# Patient Record
Sex: Male | Born: 1955 | Race: White | Hispanic: No | Smoking: Current every day smoker
Health system: Southern US, Community
[De-identification: ages and names within clinical notes are randomized; demographics above are authoritative.]

## PROBLEM LIST (undated history)

## (undated) DIAGNOSIS — Z8739 Personal history of other diseases of the musculoskeletal system and connective tissue: Secondary | ICD-10-CM

## (undated) DIAGNOSIS — N189 Chronic kidney disease, unspecified: Secondary | ICD-10-CM

## (undated) DIAGNOSIS — E785 Hyperlipidemia, unspecified: Secondary | ICD-10-CM

## (undated) DIAGNOSIS — H3530 Unspecified macular degeneration: Secondary | ICD-10-CM

## (undated) DIAGNOSIS — Z862 Personal history of diseases of the blood and blood-forming organs and certain disorders involving the immune mechanism: Secondary | ICD-10-CM

## (undated) DIAGNOSIS — J449 Chronic obstructive pulmonary disease, unspecified: Secondary | ICD-10-CM

## (undated) DIAGNOSIS — I10 Essential (primary) hypertension: Secondary | ICD-10-CM

## (undated) DIAGNOSIS — E119 Type 2 diabetes mellitus without complications: Secondary | ICD-10-CM

## (undated) DIAGNOSIS — J302 Other seasonal allergic rhinitis: Secondary | ICD-10-CM

## (undated) DIAGNOSIS — N289 Disorder of kidney and ureter, unspecified: Secondary | ICD-10-CM

## (undated) DIAGNOSIS — K219 Gastro-esophageal reflux disease without esophagitis: Secondary | ICD-10-CM

## (undated) DIAGNOSIS — I2510 Atherosclerotic heart disease of native coronary artery without angina pectoris: Secondary | ICD-10-CM

## (undated) DIAGNOSIS — R609 Edema, unspecified: Secondary | ICD-10-CM

## (undated) DIAGNOSIS — E039 Hypothyroidism, unspecified: Secondary | ICD-10-CM

## (undated) HISTORY — DX: Edema, unspecified: R60.9

## (undated) HISTORY — DX: Hyperlipidemia, unspecified: E78.5

## (undated) HISTORY — DX: Personal history of other diseases of the musculoskeletal system and connective tissue: Z87.39

## (undated) HISTORY — DX: Other seasonal allergic rhinitis: J30.2

## (undated) HISTORY — DX: Atherosclerotic heart disease of native coronary artery without angina pectoris: I25.10

## (undated) HISTORY — DX: Essential (primary) hypertension: I10

## (undated) HISTORY — DX: Personal history of diseases of the blood and blood-forming organs and certain disorders involving the immune mechanism: Z86.2

## (undated) HISTORY — DX: Chronic kidney disease, unspecified: N18.9

## (undated) HISTORY — DX: Unspecified macular degeneration: H35.30

## (undated) HISTORY — DX: Chronic obstructive pulmonary disease, unspecified: J44.9

## (undated) HISTORY — DX: Disorder of kidney and ureter, unspecified: N28.9

---

## 2011-08-04 DIAGNOSIS — I219 Acute myocardial infarction, unspecified: Secondary | ICD-10-CM | POA: Insufficient documentation

## 2011-08-04 DIAGNOSIS — I252 Old myocardial infarction: Secondary | ICD-10-CM | POA: Insufficient documentation

## 2011-08-04 HISTORY — DX: Old myocardial infarction: I25.2

## 2011-08-04 HISTORY — PX: CORONARY ANGIOPLASTY WITH STENT PLACEMENT: SHX49

## 2011-08-04 HISTORY — DX: Acute myocardial infarction, unspecified: I21.9

## 2015-03-22 DIAGNOSIS — I10 Essential (primary) hypertension: Secondary | ICD-10-CM | POA: Insufficient documentation

## 2015-03-22 DIAGNOSIS — F17200 Nicotine dependence, unspecified, uncomplicated: Secondary | ICD-10-CM

## 2015-03-22 DIAGNOSIS — I2510 Atherosclerotic heart disease of native coronary artery without angina pectoris: Secondary | ICD-10-CM

## 2015-03-22 DIAGNOSIS — E7800 Pure hypercholesterolemia, unspecified: Secondary | ICD-10-CM

## 2015-03-22 HISTORY — DX: Atherosclerotic heart disease of native coronary artery without angina pectoris: I25.10

## 2015-03-22 HISTORY — DX: Essential (primary) hypertension: I10

## 2015-03-22 HISTORY — DX: Nicotine dependence, unspecified, uncomplicated: F17.200

## 2015-03-22 HISTORY — DX: Pure hypercholesterolemia, unspecified: E78.00

## 2017-07-16 DIAGNOSIS — Z720 Tobacco use: Secondary | ICD-10-CM

## 2017-07-16 DIAGNOSIS — E785 Hyperlipidemia, unspecified: Secondary | ICD-10-CM | POA: Insufficient documentation

## 2017-07-16 HISTORY — DX: Tobacco use: Z72.0

## 2020-05-14 ENCOUNTER — Encounter: Payer: Self-pay | Admitting: Cardiology

## 2020-05-14 DIAGNOSIS — Z8739 Personal history of other diseases of the musculoskeletal system and connective tissue: Secondary | ICD-10-CM

## 2020-05-14 DIAGNOSIS — J302 Other seasonal allergic rhinitis: Secondary | ICD-10-CM | POA: Insufficient documentation

## 2020-05-14 DIAGNOSIS — R609 Edema, unspecified: Secondary | ICD-10-CM | POA: Insufficient documentation

## 2020-05-14 DIAGNOSIS — I2510 Atherosclerotic heart disease of native coronary artery without angina pectoris: Secondary | ICD-10-CM | POA: Insufficient documentation

## 2020-05-14 DIAGNOSIS — I10 Essential (primary) hypertension: Secondary | ICD-10-CM | POA: Insufficient documentation

## 2020-05-14 DIAGNOSIS — E785 Hyperlipidemia, unspecified: Secondary | ICD-10-CM | POA: Insufficient documentation

## 2020-05-14 DIAGNOSIS — J449 Chronic obstructive pulmonary disease, unspecified: Secondary | ICD-10-CM | POA: Insufficient documentation

## 2020-05-14 DIAGNOSIS — N289 Disorder of kidney and ureter, unspecified: Secondary | ICD-10-CM

## 2020-05-14 DIAGNOSIS — N189 Chronic kidney disease, unspecified: Secondary | ICD-10-CM

## 2020-05-14 DIAGNOSIS — Z862 Personal history of diseases of the blood and blood-forming organs and certain disorders involving the immune mechanism: Secondary | ICD-10-CM

## 2020-05-14 DIAGNOSIS — H3530 Unspecified macular degeneration: Secondary | ICD-10-CM | POA: Insufficient documentation

## 2020-06-04 ENCOUNTER — Ambulatory Visit (INDEPENDENT_AMBULATORY_CARE_PROVIDER_SITE_OTHER): Payer: Medicare Other | Admitting: Cardiology

## 2020-06-04 ENCOUNTER — Other Ambulatory Visit: Payer: Self-pay

## 2020-06-04 ENCOUNTER — Encounter: Payer: Self-pay | Admitting: Cardiology

## 2020-06-04 VITALS — BP 80/40 | HR 56 | Ht 68.0 in | Wt 206.0 lb

## 2020-06-04 DIAGNOSIS — I2510 Atherosclerotic heart disease of native coronary artery without angina pectoris: Secondary | ICD-10-CM | POA: Diagnosis not present

## 2020-06-04 DIAGNOSIS — I10 Essential (primary) hypertension: Secondary | ICD-10-CM | POA: Diagnosis not present

## 2020-06-04 DIAGNOSIS — J410 Simple chronic bronchitis: Secondary | ICD-10-CM | POA: Diagnosis not present

## 2020-06-04 DIAGNOSIS — I739 Peripheral vascular disease, unspecified: Secondary | ICD-10-CM

## 2020-06-04 DIAGNOSIS — R0600 Dyspnea, unspecified: Secondary | ICD-10-CM

## 2020-06-04 DIAGNOSIS — E782 Mixed hyperlipidemia: Secondary | ICD-10-CM | POA: Diagnosis not present

## 2020-06-04 DIAGNOSIS — F17200 Nicotine dependence, unspecified, uncomplicated: Secondary | ICD-10-CM

## 2020-06-04 DIAGNOSIS — R0609 Other forms of dyspnea: Secondary | ICD-10-CM

## 2020-06-04 HISTORY — DX: Peripheral vascular disease, unspecified: I73.9

## 2020-06-04 NOTE — Patient Instructions (Signed)
Medication Instructions:  Your physician recommends that you continue on your current medications as directed. Please refer to the Current Medication list given to you today.  *If you need a refill on your cardiac medications before your next appointment, please call your pharmacy*   Lab Work: None. If you have labs (blood work) drawn today and your tests are completely normal, you will receive your results only by: Marland Kitchen MyChart Message (if you have MyChart) OR . A paper copy in the mail If you have any lab test that is abnormal or we need to change your treatment, we will call you to review the results.   Testing/Procedures: Your physician has requested that you have an echocardiogram. Echocardiography is a painless test that uses sound waves to create images of your heart. It provides your doctor with information about the size and shape of your heart and how well your heart's chambers and valves are working. This procedure takes approximately one hour. There are no restrictions for this procedure.  Your physician has requested that you have a lower or upper extremity arterial duplex. This test is an ultrasound of the arteries in the legs or arms. It looks at arterial blood flow in the legs and arms. Allow one hour for Lower and Upper Arterial scans. There are no restrictions or special instructions    Follow-Up: At [facility], you and your health needs are our priority.  As part of our continuing mission to provide you with exceptional heart care, we have created designated Provider Care Teams.  These Care Teams include your primary Cardiologist (physician) and Advanced Practice Providers (APPs -  Physician Assistants and Nurse Practitioners) who all work together to provide you with the care you need, when you need it.  We recommend signing up for the patient portal called "MyChart".  Sign up information is provided on this After Visit Summary.  MyChart is used to connect with patients  for Virtual Visits (Telemedicine).  Patients are able to view lab/test results, encounter notes, upcoming appointments, etc.  Non-urgent messages can be sent to your provider as well.   To learn more about what you can do with MyChart, go to [email or web address].    Your next appointment:   3 month(s)  The format for your next appointment:   In Person  Provider:   Jenne Campus, MD   Other Instructions   Echocardiogram An echocardiogram is a procedure that uses painless sound waves (ultrasound) to produce an image of the heart. Images from an echocardiogram can provide important information about:  Signs of coronary artery disease (CAD).  Aneurysm detection. An aneurysm is a weak or damaged part of an artery wall that bulges out from the normal force of blood pumping through the body.  Heart size and shape. Changes in the size or shape of the heart can be associated with certain conditions, including heart failure, aneurysm, and CAD.  Heart muscle function.  Heart valve function.  Signs of a past heart attack.  Fluid buildup around the heart.  Thickening of the heart muscle.  A tumor or infectious growth around the heart valves. Tell a health care provider about:  Any allergies you have.  All medicines you are taking, including vitamins, herbs, eye drops, creams, and over-the-counter medicines.  Any blood disorders you have.  Any surgeries you have had.  Any medical conditions you have.  Whether you are pregnant or may be pregnant. What are the risks? Generally, this is a safe procedure. However, problems  may occur, including:  Allergic reaction to dye (contrast) that may be used during the procedure. What happens before the procedure? No specific preparation is needed. You may eat and drink normally. What happens during the procedure?   An IV tube may be inserted into one of your veins.  You may receive contrast through this tube. A contrast is an  injection that improves the quality of the pictures from your heart.  A gel will be applied to your chest.  A wand-like tool (transducer) will be moved over your chest. The gel will help to transmit the sound waves from the transducer.  The sound waves will harmlessly bounce off of your heart to allow the heart images to be captured in real-time motion. The images will be recorded on a computer. The procedure may vary among health care providers and hospitals. What happens after the procedure?  You may return to your normal, everyday life, including diet, activities, and medicines, unless your health care provider tells you not to do that. Summary  An echocardiogram is a procedure that uses painless sound waves (ultrasound) to produce an image of the heart.  Images from an echocardiogram can provide important information about the size and shape of your heart, heart muscle function, heart valve function, and fluid buildup around your heart.  You do not need to do anything to prepare before this procedure. You may eat and drink normally.  After the echocardiogram is completed, you may return to your normal, everyday life, unless your health care provider tells you not to do that. This information is not intended to replace advice given to you by your health care provider. Make sure you discuss any questions you have with your health care provider. Document Revised: 11/10/2018 Document Reviewed: 08/22/2016 Elsevier Patient Education  [place].

## 2020-06-04 NOTE — Progress Notes (Signed)
Cardiology Consultation:    Date:  06/04/2020   ID:  Todd Mckee, DOB September 30, 1955, MRN [id number]  PCP:  Helen Hashimoto., MD  Cardiologist:  Jenne Campus, MD   Referring MD: Helen Hashimoto., MD   Chief Complaint  Patient presents with  . Coronary Artery Disease    History of Present Illness:    Todd Mckee is a 64 y.o. male who is being seen today for the evaluation of coronary artery disease at the request of Helen Hashimoto., MD.  With past medical history significant for coronary artery disease, status post PTCA and stenting of RCA and LAD which was done in 2013, chronic smoking, hyperlipidemia, essential hypertension, COPD.  I have seen him last time in 2017.  He is being followed by group from [facility] likely is being seen on a yearly basis.  Denies have any cardiac complaints meaning there is no chest pain tightness squeezing pressure burning chest but his exercise is severely limited because of knee as well as leg pain.  On top of that recently he noticed swelling of his face legs arms he was given some diuretic with good response and now does not have swelling anymore.  He still continues to smoke however he quit for a while when he was put on Chantix, he learned the Chantix has been withdrawn from the market and immediately stop it.  Walking is difficult because of right knee pain as well as calf and thigh sprain.  He does have mild strain.  Past Medical History:  Diagnosis Date  . COPD (chronic obstructive pulmonary disease) ([place])   . Coronary artery disease   . H/O degenerative disc disease   . History of anemia due to chronic kidney disease   . Hyperlipidemia   . Hypertension   . Macular degeneration   . Myocardial infarct ([place]) 2013  . Peripheral edema   . Renal insufficiency   . Seasonal allergic rhinitis     Past Surgical History:  Procedure Laterality Date  . CORONARY ANGIOPLASTY WITH STENT PLACEMENT  2013    Current  Medications: Current Meds  Medication Sig  . albuterol (VENTOLIN HFA) 108 (90 Base) MCG/ACT inhaler SMARTSIG:2 Puff(s) By Mouth Every 6 Hours PRN  . aspirin EC 81 MG tablet Take 81 mg by mouth daily. Swallow whole.  . empagliflozin (JARDIANCE) 10 MG TABS tablet Take 10 mg by mouth daily.  . furosemide (LASIX) 40 MG tablet Take 40 mg by mouth.  Marland Kitchen lisinopril (ZESTRIL) 2.5 MG tablet Take 2.5 mg by mouth daily.  . metoprolol tartrate (LOPRESSOR) 25 MG tablet Take 25 mg by mouth daily.  . rosuvastatin (CRESTOR) 40 MG tablet Take 40 mg by mouth daily.  Marland Kitchen terazosin (HYTRIN) 5 MG capsule Take 5 mg by mouth at bedtime.     Allergies:   Patient has no known allergies.   Social History   Socioeconomic History  . Marital status: Divorced    Spouse name: Not on file  . Number of children: Not on file  . Years of education: Not on file  . Highest education level: Not on file  Occupational History  . Not on file  Tobacco Use  . Smoking status: Current Every Day Smoker  . Smokeless tobacco: Never Used  Substance and Sexual Activity  . Alcohol use: Never  . Drug use: Never  . Sexual activity: Not on file  Other Topics Concern  . Not on file  Social History Narrative  . Not on  file   Social Determinants of Health   Financial Resource Strain:   . Difficulty of Paying Living Expenses: Not on file  Food Insecurity:   . Worried About Charity fundraiser in the Last Year: Not on file  . Ran Out of Food in the Last Year: Not on file  Transportation Needs:   . Lack of Transportation (Medical): Not on file  . Lack of Transportation (Non-Medical): Not on file  Physical Activity:   . Days of Exercise per Week: Not on file  . Minutes of Exercise per Session: Not on file  Stress:   . Feeling of Stress : Not on file  Social Connections:   . Frequency of Communication with Friends and Family: Not on file  . Frequency of Social Gatherings with Friends and Family: Not on file  . Attends  Religious Services: Not on file  . Active Member of Clubs or Organizations: Not on file  . Attends Archivist Meetings: Not on file  . Marital Status: Not on file     Family History: The patient's family history includes Arthritis in his mother; Asthma in his father; Heart disease in his father; Hypertension in his father; Thyroid disease in his brother, father, and mother. ROS:   Please see the history of present illness.    All 14 point review of systems negative except as described per history of present illness.  EKGs/Labs/Other Studies Reviewed:    The following studies were reviewed today:   EKG:  EKG is  ordered today.  The ekg ordered today demonstrates sinus bradycardia rate 56, low voltage EKG, normal QS complex duration morphology, nonspecific ST segment changes  Recent Labs: No results found for requested labs within last 8760 hours.  Recent Lipid Panel No results found for: CHOL, TRIG, HDL, CHOLHDL, VLDL, LDLCALC, LDLDIRECT  Physical Exam:    VS:  BP (!) 80/40 (BP Location: Right Arm, Patient Position: Sitting, Cuff Size: Normal)   Pulse (!) 56   Ht 5\' 8"  (1.727 m)   Wt 206 lb (93.4 kg)   SpO2 96%   BMI 31.32 kg/m     Wt Readings from Last 3 Encounters:  06/04/20 206 lb (93.4 kg)  05/14/20 207 lb (93.9 kg)     GEN:  Well nourished, well developed in no acute distress HEENT: Normal NECK: No JVD; No carotid bruits LYMPHATICS: No lymphadenopathy CARDIAC: RRR, no murmurs, no rubs, no gallops RESPIRATORY:  Clear to auscultation without rales, wheezing or rhonchi  ABDOMEN: Soft, non-tender, non-distended MUSCULOSKELETAL:  No edema; No deformity  SKIN: Warm and dry NEUROLOGIC:  Alert and oriented x 3 PSYCHIATRIC:  Normal affect   ASSESSMENT:    1. Coronary artery disease involving native coronary artery of native heart without angina pectoris   2. Primary hypertension   3. Mixed hyperlipidemia   4. Simple chronic bronchitis (HCC)   5. Smoking    6. Claudication in peripheral vascular disease ([place])    PLAN:    In order of problems listed above:  1. Coronary disease status post PTCA and stenting of the LAD and RCA in 2013, does not have any symptoms from that aspect however his ability to exercise is very limited.  In the future we probably will need to do evaluation for coronary artery disease.  Right now the most urging problem is the fact that he does have some swelling of lower extremities which improved with diuretics.  He will be scheduled to have an echocardiogram to  assess left ventricle ejection fraction.  Also will look for pulmonary artery pressure. 2. Essential hypertension his blood pressure today is low, however he is completely asymptomatic.  He is also bradycardic.  I will see him within next few months to revisit that issue.  Denies having any syncope. 3. Mixed dyslipidemia: I did review his CHEM 7 as well as fasting lipid profile from primary care physician which show me his LDL of 88 and HDL of 32, triglyceride 245.  This is when he was recently diagnosed with diabetes.  He is already on high intensity statin in form of Crestor which is appropriate.  He is also on aspirin which I will continue. 4. Smoking obviously big problem.  He was taking Chantix and did not smoke but then he find out the Chantix has been withdrawn from the market he stopped it and went back to smoking.  Obviously this problem I stressed importance of quitting. 5. Claudications.  I do not feel pulses in lower extremities.  I will ask him to have arterial duplex of lower extremities. 6. Diabetes mellitus which is recent discovery.  Initially he was on insulin with nice control of glucose now he is being switched for the Jardiance.  To be followed by internal medicine team.   Medication Adjustments/Labs and Tests Ordered: Current medicines are reviewed at length with the patient today.  Concerns regarding medicines are outlined above.  No orders of the  defined types were placed in this encounter.  No orders of the defined types were placed in this encounter.   Signed, Park Liter, MD, [facility]. 06/04/2020 3:14 PM    [place]

## 2020-06-26 ENCOUNTER — Ambulatory Visit (INDEPENDENT_AMBULATORY_CARE_PROVIDER_SITE_OTHER): Payer: Medicare Other

## 2020-06-26 ENCOUNTER — Other Ambulatory Visit: Payer: Self-pay

## 2020-06-26 DIAGNOSIS — I739 Peripheral vascular disease, unspecified: Secondary | ICD-10-CM | POA: Diagnosis not present

## 2020-06-26 DIAGNOSIS — R0609 Other forms of dyspnea: Secondary | ICD-10-CM

## 2020-06-26 DIAGNOSIS — R0600 Dyspnea, unspecified: Secondary | ICD-10-CM | POA: Diagnosis not present

## 2020-06-26 DIAGNOSIS — E782 Mixed hyperlipidemia: Secondary | ICD-10-CM

## 2020-06-26 DIAGNOSIS — I2510 Atherosclerotic heart disease of native coronary artery without angina pectoris: Secondary | ICD-10-CM

## 2020-06-26 DIAGNOSIS — I10 Essential (primary) hypertension: Secondary | ICD-10-CM

## 2020-06-26 LAB — ECHOCARDIOGRAM COMPLETE
Area-P 1/2: 2.71 cm2
S' Lateral: 3.1 cm

## 2020-06-26 NOTE — Progress Notes (Signed)
Complete echocardiogram has been performed.  Jimmy Tejon Gracie RDCS, RVT 

## 2020-06-26 NOTE — Progress Notes (Signed)
Bilateral lower extremity arterial duplex exam performed.  Jimmy Yariel Ferraris RDCS, RVT 

## 2020-07-11 ENCOUNTER — Telehealth: Payer: Self-pay | Admitting: Cardiology

## 2020-07-11 NOTE — Telephone Encounter (Signed)
Todd Mckee is retuning Todd Mckee's call. Please advise.

## 2020-07-11 NOTE — Telephone Encounter (Signed)
Patient daughter, Ivin Booty came by office and stated she would like for someone to call with VAS [place] results. She stated her dad is hard of hearing and they would like to know the results. Her phone number is [phone].

## 2020-07-11 NOTE — Telephone Encounter (Signed)
Called Todd Mckee back. Informed her of results not further questions.

## 2020-07-11 NOTE — Telephone Encounter (Signed)
Left message for Todd Mckee to return call.

## 2020-07-15 ENCOUNTER — Other Ambulatory Visit: Payer: Self-pay

## 2020-07-16 ENCOUNTER — Ambulatory Visit (INDEPENDENT_AMBULATORY_CARE_PROVIDER_SITE_OTHER): Payer: Medicare Other | Admitting: Cardiology

## 2020-07-16 ENCOUNTER — Encounter: Payer: Self-pay | Admitting: Cardiology

## 2020-07-16 ENCOUNTER — Other Ambulatory Visit: Payer: Self-pay

## 2020-07-16 VITALS — BP 90/58 | HR 64 | Ht 70.0 in | Wt 210.0 lb

## 2020-07-16 DIAGNOSIS — R0789 Other chest pain: Secondary | ICD-10-CM

## 2020-07-16 DIAGNOSIS — I10 Essential (primary) hypertension: Secondary | ICD-10-CM

## 2020-07-16 DIAGNOSIS — I252 Old myocardial infarction: Secondary | ICD-10-CM

## 2020-07-16 DIAGNOSIS — R079 Chest pain, unspecified: Secondary | ICD-10-CM

## 2020-07-16 DIAGNOSIS — I2510 Atherosclerotic heart disease of native coronary artery without angina pectoris: Secondary | ICD-10-CM

## 2020-07-16 DIAGNOSIS — Z720 Tobacco use: Secondary | ICD-10-CM

## 2020-07-16 DIAGNOSIS — J410 Simple chronic bronchitis: Secondary | ICD-10-CM

## 2020-07-16 HISTORY — DX: Other chest pain: R07.89

## 2020-07-16 NOTE — Progress Notes (Signed)
Cardiology Office Note:    Date:  07/16/2020   ID:  Todd Mckee, DOB Jun 07, 1956, MRN [id number]  PCP:  Helen Hashimoto., MD  Cardiologist:  Jenne Campus, MD    Referring MD: Helen Hashimoto., MD   Chief Complaint  Patient presents with  . Chest Pain  I had chest pain on Saturday  History of Present Illness:    Todd Mckee is a 64 y.o. male past medical history significant for coronary artery disease, he did have PTCA and stenting of the RCA and LAD which was done in 2013, another history include chronic smoker which is heavy, hyperlipidemia, essential hypertension, COPD.  Comes today 2 months of follow-up.  He did have echocardiogram done which showed preserved left ventricle ejection fraction, he did have diastolic dysfunction.  He misunderstood and he thinks that more than 35% of his heart is gone and he is alarmed by that.  On top of that on Saturday he was sitting at the desk playing with a computer and developed sharp pain lasting for about 30 seconds in the left side of his chest he terribly worried since he is hard.  Does not have any exertional chest pain.  Sadly he still continues to smoke he smokes about 1-1/2 even more pack per day he got very yellow fingers I suspect he got cigarettes in his fingers all the time I do smell cigarettes from him when he is in the room.  He also eat a lot of bad food which include bacon in the morning with gravy and biscuits.  Past Medical History:  Diagnosis Date  . Claudication in peripheral vascular disease ([place]) 06/04/2020  . COPD (chronic obstructive pulmonary disease) ([place])   . Coronary artery disease   . Coronary artery disease involving native coronary artery of native heart without angina pectoris 03/22/2015   Formatting of this note might be different from the original. Non-drug-eluting stent to completely occluded right coronary artery in July 2013, non-drug-eluting stent to proximal LAD in July 2013  . Essential hypertension  03/22/2015  . H/O degenerative disc disease   . History of anemia due to chronic kidney disease   . Hyperlipidemia   . Hypertension   . Macular degeneration   . Myocardial infarct ([place]) 2013  . Old MI (myocardial infarction) 2013  . Peripheral edema   . Pure hypercholesterolemia 03/22/2015  . Renal insufficiency   . Seasonal allergic rhinitis   . Smoking 03/22/2015  . Tobacco use 07/16/2017    Past Surgical History:  Procedure Laterality Date  . CORONARY ANGIOPLASTY WITH STENT PLACEMENT  2013    Current Medications: Current Meds  Medication Sig  . albuterol (VENTOLIN HFA) 108 (90 Base) MCG/ACT inhaler SMARTSIG:2 Puff(s) By Mouth Every 6 Hours PRN  . aspirin EC 81 MG tablet Take 81 mg by mouth daily. Swallow whole.  . empagliflozin (JARDIANCE) 10 MG TABS tablet Take 10 mg by mouth daily.  . furosemide (LASIX) 40 MG tablet Take 40 mg by mouth.  Marland Kitchen lisinopril (ZESTRIL) 2.5 MG tablet Take 2.5 mg by mouth daily.  . metoprolol tartrate (LOPRESSOR) 25 MG tablet Take 25 mg by mouth daily.  Marland Kitchen rOPINIRole (REQUIP) 0.5 MG tablet Take 0.5 mg by mouth 2 (two) times daily.  . rosuvastatin (CRESTOR) 40 MG tablet Take 40 mg by mouth daily.     Allergies:   Patient has no known allergies.   Social History   Socioeconomic History  . Marital status: Divorced    Spouse name:  Not on file  . Number of children: Not on file  . Years of education: Not on file  . Highest education level: Not on file  Occupational History  . Not on file  Tobacco Use  . Smoking status: Current Every Day Smoker  . Smokeless tobacco: Never Used  Substance and Sexual Activity  . Alcohol use: Never  . Drug use: Never  . Sexual activity: Not on file  Other Topics Concern  . Not on file  Social History Narrative  . Not on file   Social Determinants of Health   Financial Resource Strain: Not on file  Food Insecurity: Not on file  Transportation Needs: Not on file  Physical Activity: Not on file  Stress: Not  on file  Social Connections: Not on file     Family History: The patient's family history includes Arthritis in his mother; Asthma in his father; Heart disease in his father; Hypertension in his father; Thyroid disease in his brother, father, and mother. ROS:   Please see the history of present illness.    All 14 point review of systems negative except as described per history of present illness  EKGs/Labs/Other Studies Reviewed:      Recent Labs: No results found for requested labs within last 8760 hours.  Recent Lipid Panel No results found for: CHOL, TRIG, HDL, CHOLHDL, VLDL, LDLCALC, LDLDIRECT  Physical Exam:    VS:  BP (!) 90/58 (BP Location: Right Arm, Patient Position: Sitting)   Pulse 64   Ht 5\' 10"  (1.778 m)   Wt 210 lb (95.3 kg)   SpO2 96%   BMI 30.13 kg/m     Wt Readings from Last 3 Encounters:  07/16/20 210 lb (95.3 kg)  06/04/20 206 lb (93.4 kg)  05/14/20 207 lb (93.9 kg)     GEN:  Well nourished, well developed in no acute distress HEENT: Normal NECK: No JVD; No carotid bruits LYMPHATICS: No lymphadenopathy CARDIAC: RRR, no murmurs, no rubs, no gallops RESPIRATORY:  Clear to auscultation without rales, wheezing or rhonchi  ABDOMEN: Soft, non-tender, non-distended MUSCULOSKELETAL:  No edema; No deformity  SKIN: Warm and dry LOWER EXTREMITIES: no swelling NEUROLOGIC:  Alert and oriented x 3 PSYCHIATRIC:  Normal affect   ASSESSMENT:    1. Chest pain of uncertain etiology   2. Coronary artery disease involving native coronary artery of native heart without angina pectoris   3. Essential hypertension   4. Old MI (myocardial infarction)   5. Tobacco use   6. Simple chronic bronchitis ([place])   7. Atypical chest pain    PLAN:    In order of problems listed above:  1. Chest pain which is very atypical EKG did not show any acute changes, I will check troponin I today. 2. Coronary artery disease in the future we may evaluate him for reactivation of the  problem but he does have some additional problem I will not rush to it unless I will have more indication suggesting the problem.  The biggest issue is his creatinine being more than 2. 3. Diastolic congestive heart failure.  I will ask you to have Chem-7 done and proBNP today. 4. Tobacco use obviously big problem I strongly recommended to quit he said he will try to do that. 5. Overall is a very difficult situation he got difficult time understanding all concepts we talked in length about dieting and need to exercise.  Luckily, his daughter was with [place] in the room and she got much better  understanding of his condition of health try to help him to manage.   Medication Adjustments/Labs and Tests Ordered: Current medicines are reviewed at length with the patient today.  Concerns regarding medicines are outlined above.  Orders Placed This Encounter  Procedures  . EKG 12-Lead   Medication changes: No orders of the defined types were placed in this encounter.   Signed, Park Liter, MD, [facility] 07/16/2020 4:46 PM    [place]

## 2020-07-16 NOTE — Addendum Note (Signed)
Addended by: Senaida Ores on: 07/16/2020 04:59 PM   Modules accepted: Orders

## 2020-07-16 NOTE — Patient Instructions (Signed)
Medication Instructions:  Your physician recommends that you continue on your current medications as directed. Please refer to the Current Medication list given to you today.  *If you need a refill on your cardiac medications before your next appointment, please call your pharmacy*   Lab Work: Your physician recommends that you return for lab work today: bmp, troponin  If you have labs (blood work) drawn today and your tests are completely normal, you will receive your results only by:  [place] (if you have MyChart) OR  A paper copy in the mail If you have any lab test that is abnormal or we need to change your treatment, we will call you to review the results.   Testing/Procedures: None.   Follow-Up: At [facility], you and your health needs are our priority.  As part of our continuing mission to provide you with exceptional heart care, we have created designated Provider Care Teams.  These Care Teams include your primary Cardiologist (physician) and Advanced Practice Providers (APPs -  Physician Assistants and Nurse Practitioners) who all work together to provide you with the care you need, when you need it.  We recommend signing up for the patient portal called "MyChart".  Sign up information is provided on this After Visit Summary.  MyChart is used to connect with patients for Virtual Visits (Telemedicine).  Patients are able to view lab/test results, encounter notes, upcoming appointments, etc.  Non-urgent messages can be sent to your provider as well.   To learn more about what you can do with MyChart, go to [email or web address].    Your next appointment:   2 month(s)  The format for your next appointment:   In Person  Provider:   Jenne Campus, MD   Other Instructions

## 2020-07-17 LAB — BASIC METABOLIC PANEL
BUN/Creatinine Ratio: 11 (ref 10–24)
BUN: 32 mg/dL — ABNORMAL HIGH (ref 8–27)
CO2: 26 mmol/L (ref 20–29)
Calcium: 9.4 mg/dL (ref 8.6–10.2)
Chloride: 107 mmol/L — ABNORMAL HIGH (ref 96–106)
Creatinine, Ser: 2.93 mg/dL — ABNORMAL HIGH (ref 0.76–1.27)
GFR calc Af Amer: 25 mL/min/{1.73_m2} — ABNORMAL LOW (ref >59)
GFR calc non Af Amer: 21 mL/min/{1.73_m2} — ABNORMAL LOW (ref >59)
Glucose: 103 mg/dL — ABNORMAL HIGH (ref 65–99)
Potassium: 4.1 mmol/L (ref 3.5–5.2)
Sodium: 146 mmol/L — ABNORMAL HIGH (ref 134–144)

## 2020-07-17 LAB — TROPONIN T: Troponin T (Highly Sensitive): 184 ng/L (ref 0–22)

## 2020-07-18 ENCOUNTER — Telehealth: Payer: Self-pay | Admitting: Emergency Medicine

## 2020-07-18 DIAGNOSIS — R778 Other specified abnormalities of plasma proteins: Secondary | ICD-10-CM

## 2020-07-18 NOTE — Telephone Encounter (Signed)
Spoke to patients daughter per dpr. Informed her of results. She is going to try to bring him for labs today if she can't she will let me know. No further questions.

## 2020-07-18 NOTE — Telephone Encounter (Signed)
-----   Message from Park Liter, MD sent at 07/18/2020  8:36 AM EST ----- Troponin I elevated, but also have kidney dysfunction.  Lets repeat troponin I again today

## 2020-07-19 ENCOUNTER — Telehealth: Payer: Self-pay | Admitting: Cardiology

## 2020-07-19 LAB — TROPONIN T: Troponin T (Highly Sensitive): 197 ng/L (ref 0–22)

## 2020-07-19 NOTE — Telephone Encounter (Signed)
Called patient daughter informed her of results per dpr.

## 2020-07-19 NOTE — Telephone Encounter (Signed)
Patient's daughter is calling for results

## 2020-09-04 DIAGNOSIS — I2510 Atherosclerotic heart disease of native coronary artery without angina pectoris: Secondary | ICD-10-CM | POA: Insufficient documentation

## 2020-09-04 DIAGNOSIS — I10 Essential (primary) hypertension: Secondary | ICD-10-CM | POA: Insufficient documentation

## 2020-09-06 ENCOUNTER — Ambulatory Visit: Payer: Medicare Other | Admitting: Cardiology

## 2020-11-01 ENCOUNTER — Other Ambulatory Visit: Payer: Self-pay

## 2020-11-01 ENCOUNTER — Ambulatory Visit (INDEPENDENT_AMBULATORY_CARE_PROVIDER_SITE_OTHER): Payer: Medicare Other | Admitting: Cardiology

## 2020-11-01 ENCOUNTER — Encounter: Payer: Self-pay | Admitting: Cardiology

## 2020-11-01 VITALS — BP 84/50 | HR 51 | Ht 70.0 in | Wt 205.0 lb

## 2020-11-01 DIAGNOSIS — I2510 Atherosclerotic heart disease of native coronary artery without angina pectoris: Secondary | ICD-10-CM | POA: Diagnosis not present

## 2020-11-01 DIAGNOSIS — I10 Essential (primary) hypertension: Secondary | ICD-10-CM | POA: Diagnosis not present

## 2020-11-01 DIAGNOSIS — J410 Simple chronic bronchitis: Secondary | ICD-10-CM | POA: Diagnosis not present

## 2020-11-01 DIAGNOSIS — Z720 Tobacco use: Secondary | ICD-10-CM

## 2020-11-01 DIAGNOSIS — Z862 Personal history of diseases of the blood and blood-forming organs and certain disorders involving the immune mechanism: Secondary | ICD-10-CM

## 2020-11-01 DIAGNOSIS — N189 Chronic kidney disease, unspecified: Secondary | ICD-10-CM

## 2020-11-01 MED ORDER — FUROSEMIDE 20 MG PO TABS
20.0000 mg | ORAL_TABLET | Freq: Every day | ORAL | 1 refills | Status: DC
Start: 2020-11-01 — End: 2020-12-26

## 2020-11-01 NOTE — Addendum Note (Signed)
Addended by: Senaida Ores on: 11/01/2020 10:53 AM   Modules accepted: Orders

## 2020-11-01 NOTE — Patient Instructions (Addendum)
Medication Instructions:  Your physician has recommended you make the following change in your medication:  DECREASE: Lasix to 20 mg daily   *If you need a refill on your cardiac medications before your next appointment, please call your pharmacy*   Lab Work: None If you have labs (blood work) drawn today and your tests are completely normal, you will receive your results only by: Marland Kitchen MyChart Message (if you have MyChart) OR . A paper copy in the mail If you have any lab test that is abnormal or we need to change your treatment, we will call you to review the results.   Testing/Procedures: None   Follow-Up: At [facility], you and your health needs are our priority.  As part of our continuing mission to provide you with exceptional heart care, we have created designated Provider Care Teams.  These Care Teams include your primary Cardiologist (physician) and Advanced Practice Providers (APPs -  Physician Assistants and Nurse Practitioners) who all work together to provide you with the care you need, when you need it.  We recommend signing up for the patient portal called "MyChart".  Sign up information is provided on this After Visit Summary.  MyChart is used to connect with patients for Virtual Visits (Telemedicine).  Patients are able to view lab/test results, encounter notes, upcoming appointments, etc.  Non-urgent messages can be sent to your provider as well.   To learn more about what you can do with MyChart, go to [email or web address].    Your next appointment:   5 month(s)  The format for your next appointment:   In Person  Provider:   Jenne Campus, MD   Other Instructions

## 2020-11-01 NOTE — Progress Notes (Signed)
Cardiology Office Note:    Date:  11/01/2020   ID:  Todd Mckee, DOB 11-08-1955, MRN [id number]  PCP:  Helen Hashimoto., MD  Cardiologist:  Jenne Campus, MD    Referring MD: Helen Hashimoto., MD   Chief Complaint  Patient presents with  . Follow-up  I am doing well  History of Present Illness:    Todd Mckee is a 65 y.o. male past medical history significant for coronary artery disease, he did have PTCA and stenting of the RCA and LAD which was done in 2013, another history include chronic smoker which is heavy, hyperlipidemia, essential hypertension, COPD.  He still continues to smoke heavily.  He comes local with his daughter.  He sits all day in the chair she said that he got a problem with the right knee that gives.  He apparently was referred to orthopedic but never heard anything from them.  Relates that he is doing well he denies of any chest pain tightness squeezing pressure burning in the chest but again at the same time he does not do much.  Past Medical History:  Diagnosis Date  . Atypical chest pain 07/16/2020  . Claudication in peripheral vascular disease ([place]) 06/04/2020  . COPD (chronic obstructive pulmonary disease) ([place])   . Coronary artery disease   . Coronary artery disease involving native coronary artery of native heart without angina pectoris 03/22/2015   Formatting of this note might be different from the original. Non-drug-eluting stent to completely occluded right coronary artery in July 2013, non-drug-eluting stent to proximal LAD in July 2013  . Essential hypertension 03/22/2015  . H/O degenerative disc disease   . History of anemia due to chronic kidney disease   . Hyperlipidemia   . Hypertension   . Macular degeneration   . Myocardial infarct ([place]) 2013  . Old MI (myocardial infarction) 2013  . Peripheral edema   . Pure hypercholesterolemia 03/22/2015  . Renal insufficiency   . Seasonal allergic rhinitis   . Smoking 03/22/2015  . Tobacco use  07/16/2017    Past Surgical History:  Procedure Laterality Date  . CORONARY ANGIOPLASTY WITH STENT PLACEMENT  2013    Current Medications: Current Meds  Medication Sig  . albuterol (VENTOLIN HFA) 108 (90 Base) MCG/ACT inhaler Inhale 2 puffs into the lungs as needed for wheezing or shortness of breath.  Marland Kitchen aspirin EC 81 MG tablet Take 81 mg by mouth daily. Swallow whole.  . empagliflozin (JARDIANCE) 10 MG TABS tablet Take 10 mg by mouth daily.  . furosemide (LASIX) 40 MG tablet Take 40 mg by mouth daily.  Marland Kitchen lisinopril (ZESTRIL) 2.5 MG tablet Take 2.5 mg by mouth daily.  . metoprolol tartrate (LOPRESSOR) 25 MG tablet Take 25 mg by mouth daily.  Marland Kitchen rOPINIRole (REQUIP) 0.5 MG tablet Take 0.5 mg by mouth 2 (two) times daily.  . rosuvastatin (CRESTOR) 40 MG tablet Take 40 mg by mouth daily.  Marland Kitchen terazosin (HYTRIN) 5 MG capsule Take 5 mg by mouth at bedtime.     Allergies:   Patient has no known allergies.   Social History   Socioeconomic History  . Marital status: Divorced    Spouse name: Not on file  . Number of children: Not on file  . Years of education: Not on file  . Highest education level: Not on file  Occupational History  . Not on file  Tobacco Use  . Smoking status: Current Every Day Smoker  . Smokeless tobacco: Never Used  Substance  and Sexual Activity  . Alcohol use: Never  . Drug use: Never  . Sexual activity: Not on file  Other Topics Concern  . Not on file  Social History Narrative  . Not on file   Social Determinants of Health   Financial Resource Strain: Not on file  Food Insecurity: Not on file  Transportation Needs: Not on file  Physical Activity: Not on file  Stress: Not on file  Social Connections: Not on file     Family History: The patient's family history includes Arthritis in his mother; Asthma in his father; Heart disease in his father; Hypertension in his father; Thyroid disease in his brother, father, and mother. ROS:   Please see the  history of present illness.    All 14 point review of systems negative except as described per history of present illness  EKGs/Labs/Other Studies Reviewed:      Recent Labs: 07/16/2020: BUN 32; Creatinine, Ser 2.93; Potassium 4.1; Sodium 146  Recent Lipid Panel No results found for: CHOL, TRIG, HDL, CHOLHDL, VLDL, LDLCALC, LDLDIRECT  Physical Exam:    VS:  BP (!) 84/50 (BP Location: Right Arm, Patient Position: Sitting)   Pulse (!) 51   Ht 5\' 10"  (1.778 m)   Wt 205 lb (93 kg)   SpO2 94%   BMI 29.41 kg/m     Wt Readings from Last 3 Encounters:  11/01/20 205 lb (93 kg)  07/16/20 210 lb (95.3 kg)  06/04/20 206 lb (93.4 kg)     GEN:  Well nourished, well developed in no acute distress HEENT: Normal NECK: No JVD; No carotid bruits LYMPHATICS: No lymphadenopathy CARDIAC: RRR, no murmurs, no rubs, no gallops RESPIRATORY:  Clear to auscultation without rales, wheezing or rhonchi  ABDOMEN: Soft, non-tender, non-distended MUSCULOSKELETAL:  No edema; No deformity  SKIN: Warm and dry LOWER EXTREMITIES: no swelling NEUROLOGIC:  Alert and oriented x 3 PSYCHIATRIC:  Normal affect   ASSESSMENT:    1. Coronary artery disease involving native coronary artery of native heart without angina pectoris   2. Essential hypertension   3. Simple chronic bronchitis ([place])   4. Tobacco use   5. History of anemia due to chronic kidney disease    PLAN:    In order of problems listed above:  1. Coronary disease stable asymptomatic.  Doing well from that point review on appropriate medications which I will continue. 2. Essential hypertension blood pressure is low.  Will reduce the dose of furosemide from 40-20.  I warned him about potential side effect of this maneuver which include swelling of lower extremities.  I told him to go back to 40 mg if swelling became significantly worse. 3. Dyslipidemia, he is taking cholesterol medication I did review his K PN from November of last year showing  LDL of 62 HDL 31 there is a good cholesterol profile we will continue present management. 4. Smoking huge problem he smokes a lot his fingers and yellow from smoke.  I had this multiple discussion with him and strongly recommended to quit.  Adamantly he will be able to accomplish that. 5. Anemia secondary to chronic kidney disease followed by nephrology   Medication Adjustments/Labs and Tests Ordered: Current medicines are reviewed at length with the patient today.  Concerns regarding medicines are outlined above.  No orders of the defined types were placed in this encounter.  Medication changes: No orders of the defined types were placed in this encounter.   Signed, Park Liter, MD, [facility] 11/01/2020  10:47 AM    [facility]

## 2020-12-26 ENCOUNTER — Other Ambulatory Visit: Payer: Self-pay

## 2020-12-26 ENCOUNTER — Emergency Department (HOSPITAL_COMMUNITY): Payer: Medicare Other

## 2020-12-26 ENCOUNTER — Inpatient Hospital Stay (HOSPITAL_COMMUNITY)
Admission: EM | Admit: 2020-12-26 | Discharge: 2020-12-31 | DRG: 683 | Disposition: A | Discharge status: Home / Self Care | Payer: Medicare Other | Attending: Family Medicine | Admitting: Family Medicine

## 2020-12-26 ENCOUNTER — Encounter (HOSPITAL_COMMUNITY): Payer: Self-pay | Admitting: *Deleted

## 2020-12-26 ENCOUNTER — Observation Stay (HOSPITAL_COMMUNITY): Payer: Medicare Other

## 2020-12-26 ENCOUNTER — Encounter: Payer: Self-pay | Admitting: Internal Medicine

## 2020-12-26 DIAGNOSIS — R001 Bradycardia, unspecified: Secondary | ICD-10-CM | POA: Diagnosis present

## 2020-12-26 DIAGNOSIS — D631 Anemia in chronic kidney disease: Secondary | ICD-10-CM | POA: Diagnosis present

## 2020-12-26 DIAGNOSIS — H9190 Unspecified hearing loss, unspecified ear: Secondary | ICD-10-CM | POA: Diagnosis present

## 2020-12-26 DIAGNOSIS — I2510 Atherosclerotic heart disease of native coronary artery without angina pectoris: Secondary | ICD-10-CM | POA: Diagnosis present

## 2020-12-26 DIAGNOSIS — N210 Calculus in bladder: Secondary | ICD-10-CM | POA: Diagnosis present

## 2020-12-26 DIAGNOSIS — E1151 Type 2 diabetes mellitus with diabetic peripheral angiopathy without gangrene: Secondary | ICD-10-CM | POA: Diagnosis present

## 2020-12-26 DIAGNOSIS — N401 Enlarged prostate with lower urinary tract symptoms: Secondary | ICD-10-CM | POA: Diagnosis present

## 2020-12-26 DIAGNOSIS — F17210 Nicotine dependence, cigarettes, uncomplicated: Secondary | ICD-10-CM | POA: Diagnosis present

## 2020-12-26 DIAGNOSIS — T502X5A Adverse effect of carbonic-anhydrase inhibitors, benzothiadiazides and other diuretics, initial encounter: Secondary | ICD-10-CM | POA: Diagnosis present

## 2020-12-26 DIAGNOSIS — I5022 Chronic systolic (congestive) heart failure: Secondary | ICD-10-CM | POA: Diagnosis present

## 2020-12-26 DIAGNOSIS — M6282 Rhabdomyolysis: Secondary | ICD-10-CM | POA: Diagnosis present

## 2020-12-26 DIAGNOSIS — E876 Hypokalemia: Secondary | ICD-10-CM | POA: Diagnosis present

## 2020-12-26 DIAGNOSIS — K7460 Unspecified cirrhosis of liver: Secondary | ICD-10-CM | POA: Diagnosis present

## 2020-12-26 DIAGNOSIS — D696 Thrombocytopenia, unspecified: Secondary | ICD-10-CM | POA: Diagnosis present

## 2020-12-26 DIAGNOSIS — Z825 Family history of asthma and other chronic lower respiratory diseases: Secondary | ICD-10-CM

## 2020-12-26 DIAGNOSIS — Z79899 Other long term (current) drug therapy: Secondary | ICD-10-CM

## 2020-12-26 DIAGNOSIS — E7800 Pure hypercholesterolemia, unspecified: Secondary | ICD-10-CM | POA: Diagnosis present

## 2020-12-26 DIAGNOSIS — Z720 Tobacco use: Secondary | ICD-10-CM | POA: Diagnosis present

## 2020-12-26 DIAGNOSIS — I132 Hypertensive heart and chronic kidney disease with heart failure and with stage 5 chronic kidney disease, or end stage renal disease: Secondary | ICD-10-CM | POA: Diagnosis present

## 2020-12-26 DIAGNOSIS — N189 Chronic kidney disease, unspecified: Secondary | ICD-10-CM | POA: Diagnosis present

## 2020-12-26 DIAGNOSIS — Z8249 Family history of ischemic heart disease and other diseases of the circulatory system: Secondary | ICD-10-CM

## 2020-12-26 DIAGNOSIS — Z20822 Contact with and (suspected) exposure to covid-19: Secondary | ICD-10-CM | POA: Diagnosis present

## 2020-12-26 DIAGNOSIS — N138 Other obstructive and reflux uropathy: Secondary | ICD-10-CM | POA: Diagnosis present

## 2020-12-26 DIAGNOSIS — R748 Abnormal levels of other serum enzymes: Secondary | ICD-10-CM | POA: Diagnosis present

## 2020-12-26 DIAGNOSIS — R7401 Elevation of levels of liver transaminase levels: Secondary | ICD-10-CM

## 2020-12-26 DIAGNOSIS — Z7982 Long term (current) use of aspirin: Secondary | ICD-10-CM

## 2020-12-26 DIAGNOSIS — N179 Acute kidney failure, unspecified: Secondary | ICD-10-CM

## 2020-12-26 DIAGNOSIS — K5900 Constipation, unspecified: Secondary | ICD-10-CM | POA: Diagnosis present

## 2020-12-26 DIAGNOSIS — N320 Bladder-neck obstruction: Secondary | ICD-10-CM | POA: Diagnosis present

## 2020-12-26 DIAGNOSIS — N185 Chronic kidney disease, stage 5: Secondary | ICD-10-CM | POA: Diagnosis present

## 2020-12-26 DIAGNOSIS — E1122 Type 2 diabetes mellitus with diabetic chronic kidney disease: Secondary | ICD-10-CM | POA: Diagnosis present

## 2020-12-26 DIAGNOSIS — H109 Unspecified conjunctivitis: Secondary | ICD-10-CM | POA: Diagnosis present

## 2020-12-26 DIAGNOSIS — I252 Old myocardial infarction: Secondary | ICD-10-CM

## 2020-12-26 DIAGNOSIS — J449 Chronic obstructive pulmonary disease, unspecified: Secondary | ICD-10-CM | POA: Diagnosis present

## 2020-12-26 DIAGNOSIS — Z955 Presence of coronary angioplasty implant and graft: Secondary | ICD-10-CM

## 2020-12-26 DIAGNOSIS — N170 Acute kidney failure with tubular necrosis: Principal | ICD-10-CM | POA: Diagnosis present

## 2020-12-26 DIAGNOSIS — E039 Hypothyroidism, unspecified: Secondary | ICD-10-CM | POA: Diagnosis not present

## 2020-12-26 DIAGNOSIS — H3530 Unspecified macular degeneration: Secondary | ICD-10-CM | POA: Diagnosis present

## 2020-12-26 DIAGNOSIS — I959 Hypotension, unspecified: Secondary | ICD-10-CM | POA: Diagnosis present

## 2020-12-26 DIAGNOSIS — Z7984 Long term (current) use of oral hypoglycemic drugs: Secondary | ICD-10-CM

## 2020-12-26 DIAGNOSIS — Z8349 Family history of other endocrine, nutritional and metabolic diseases: Secondary | ICD-10-CM

## 2020-12-26 LAB — URINALYSIS, ROUTINE W REFLEX MICROSCOPIC
Bilirubin Urine: NEGATIVE
Glucose, UA: >=500 mg/dL — AB
Ketones, ur: NEGATIVE mg/dL
Leukocytes,Ua: NEGATIVE
Nitrite: NEGATIVE
Protein, ur: 30 mg/dL — AB
Specific Gravity, Urine: 1.008 (ref 1.005–1.030)
pH: 5 (ref 5.0–8.0)

## 2020-12-26 LAB — CBC
HCT: 32.2 % — ABNORMAL LOW (ref 39.0–52.0)
Hemoglobin: 10.8 g/dL — ABNORMAL LOW (ref 13.0–17.0)
MCH: 32 pg (ref 26.0–34.0)
MCHC: 33.5 g/dL (ref 30.0–36.0)
MCV: 95.5 fL (ref 80.0–100.0)
Platelets: 156 10*3/uL (ref 150–400)
RBC: 3.37 MIL/uL — ABNORMAL LOW (ref 4.22–5.81)
RDW: 13.9 % (ref 11.5–15.5)
WBC: 9.8 10*3/uL (ref 4.0–10.5)
nRBC: 0 % (ref 0.0–0.2)

## 2020-12-26 LAB — COMPREHENSIVE METABOLIC PANEL
ALT: 142 U/L — ABNORMAL HIGH (ref 0–44)
AST: 193 U/L — ABNORMAL HIGH (ref 15–41)
Albumin: 3.8 g/dL (ref 3.5–5.0)
Alkaline Phosphatase: 47 U/L (ref 38–126)
Anion gap: 11 (ref 5–15)
BUN: 87 mg/dL — ABNORMAL HIGH (ref 8–23)
CO2: 23 mmol/L (ref 22–32)
Calcium: 8.9 mg/dL (ref 8.9–10.3)
Chloride: 108 mmol/L (ref 98–111)
Creatinine, Ser: 7.76 mg/dL — ABNORMAL HIGH (ref 0.61–1.24)
GFR, Estimated: 7 mL/min — ABNORMAL LOW (ref >60)
Glucose, Bld: 118 mg/dL — ABNORMAL HIGH (ref 70–99)
Potassium: 3 mmol/L — ABNORMAL LOW (ref 3.5–5.1)
Sodium: 142 mmol/L (ref 135–145)
Total Bilirubin: 0.8 mg/dL (ref 0.3–1.2)
Total Protein: 6.2 g/dL — ABNORMAL LOW (ref 6.5–8.1)

## 2020-12-26 LAB — PHOSPHORUS: Phosphorus: 5.5 mg/dL — ABNORMAL HIGH (ref 2.5–4.6)

## 2020-12-26 LAB — RESP PANEL BY RT-PCR (FLU A&B, COVID) ARPGX2
Influenza A by PCR: NEGATIVE
Influenza B by PCR: NEGATIVE
SARS Coronavirus 2 by RT PCR: NEGATIVE

## 2020-12-26 LAB — MAGNESIUM: Magnesium: 2.5 mg/dL — ABNORMAL HIGH (ref 1.7–2.4)

## 2020-12-26 LAB — HIV ANTIBODY (ROUTINE TESTING W REFLEX): HIV Screen 4th Generation wRfx: NONREACTIVE

## 2020-12-26 LAB — LIPASE, BLOOD: Lipase: 45 U/L (ref 11–51)

## 2020-12-26 MED ORDER — NICOTINE 21 MG/24HR TD PT24
21.0000 mg | MEDICATED_PATCH | Freq: Every day | TRANSDERMAL | Status: DC
  Administered 2020-12-26 – 2020-12-31 (×6): 21 mg via TRANSDERMAL
  Filled 2020-12-26 (×6): qty 1

## 2020-12-26 MED ORDER — ALBUTEROL SULFATE (2.5 MG/3ML) 0.083% IN NEBU: 2.5000 mg | INHALATION_SOLUTION | RESPIRATORY_TRACT | Status: DC | PRN

## 2020-12-26 MED ORDER — SODIUM CHLORIDE 0.9 % IV SOLN: INTRAVENOUS | Status: DC

## 2020-12-26 MED ORDER — ROPINIROLE HCL 0.5 MG PO TABS
0.5000 mg | ORAL_TABLET | Freq: Two times a day (BID) | ORAL | Status: DC
  Administered 2020-12-26 – 2020-12-31 (×10): 0.5 mg via ORAL
  Filled 2020-12-26 (×12): qty 1

## 2020-12-26 MED ORDER — TERAZOSIN HCL 5 MG PO CAPS
5.0000 mg | ORAL_CAPSULE | Freq: Every day | ORAL | Status: DC
  Administered 2020-12-26: 5 mg via ORAL
  Filled 2020-12-26: qty 1

## 2020-12-26 MED ORDER — HEPARIN SODIUM (PORCINE) 5000 UNIT/ML IJ SOLN
5000.0000 [IU] | Freq: Three times a day (TID) | INTRAMUSCULAR | Status: DC
  Administered 2020-12-26 – 2020-12-31 (×15): 5000 [IU] via SUBCUTANEOUS
  Filled 2020-12-26 (×16): qty 1

## 2020-12-26 MED ORDER — ALBUTEROL SULFATE HFA 108 (90 BASE) MCG/ACT IN AERS: 2.0000 | INHALATION_SPRAY | RESPIRATORY_TRACT | Status: DC | PRN

## 2020-12-26 MED ORDER — ASPIRIN EC 81 MG PO TBEC
81.0000 mg | DELAYED_RELEASE_TABLET | Freq: Every day | ORAL | Status: DC
  Administered 2020-12-27 – 2020-12-31 (×5): 81 mg via ORAL
  Filled 2020-12-26 (×5): qty 1

## 2020-12-26 MED ORDER — MIDODRINE HCL 5 MG PO TABS
10.0000 mg | ORAL_TABLET | Freq: Two times a day (BID) | ORAL | Status: DC
  Administered 2020-12-26 – 2020-12-31 (×10): 10 mg via ORAL
  Filled 2020-12-26 (×10): qty 2

## 2020-12-26 MED ORDER — POTASSIUM CHLORIDE CRYS ER 20 MEQ PO TBCR
40.0000 meq | EXTENDED_RELEASE_TABLET | Freq: Once | ORAL | Status: AC
  Administered 2020-12-26: 40 meq via ORAL
  Filled 2020-12-26: qty 2

## 2020-12-26 NOTE — Hospital Course (Addendum)
Todd Mckee is a 65 y.o. male who presented with acute renal failure 2/2 urinary bladder obstruction. PMH is significant for HTN, HLD, BPH, CKD, COPD, CAD, MI. Below is a summary of his hospital course listed by problem.   AKI on CKD Stage 5, 2/2 bladder outlet obstruction Patient presented with creatinine elevated to 7.76 (baseline 1.5-2). CT imaging demonstrated two large bladder stones with the largest being 2.8 cm. There was also concern for nephritis on imaging but no signs of hydroureteronephrosis. Discussed with Urology who recommended foley catheter insertion for at least 10 days and follow up outpatient for voiding trial. Nephrology also consulted and had concerns of ATN given patients history of hypotension and near-syncope at home. Patient was started on gentle hydration and midodrine to assist with renal perfusion. Patient was continued on trazosin. Throughout admission patient had net 3L of output. Creatinine had improved to 4.87 on discharge.   Hypotension Reportedly had been on-going at home prior to admission. In ED pressures abour 637'C systolics, 58'I diastolic. All anti-hypertensive medications held on admission. As stated above, patient was started on gentle hydration with IV NS below maintenance rate and midodrine 10 mg BID. Blood pressures maintained around low 502'D systolics and 74'J diastolic's with this.   Elevated LFT's AST/ALT elevated at 193/142 on admission. Work up with negative RUQ U/S and CT Abd/Pelv also without any hepatic abnormalities. Hepatitis panel negative. Hepatotoxic agents held during admission - such as statin. Also found to have elevated CK at 10,930 which down-trended with fluids and as kidney function improved. AST/ALT improved to 66/74 by day of discharge.  Type 2 DM Hgb A1c 6.4. Was not started on any medications. CBG's not routinely checked as glucose on BMP remained stable (90's-low 100's).   Hypothyroidism TSH elevated at 154. Started on Synthroid 50  mcg, tolerated well. Should have repeat TSH in 4-6 weeks.   Follow Up Recommendations: Should keep foley in until follows up with Urology (6/10) for voiding trial.  Continue to encourage tobacco cessation. Elevated LFT's with negative hepatitis panel, RUQ U/S. Repeat CMP, consider referral to GI if no improvement. Statin was started back on discharge at decreased dose given elevation in LFT's. Hypotensive, all anti-hypertensive's held at discharge. Started on Midodrine.  Started on Synthroid 50 mcg. Should have repeat TSH in 4-6 weeks.

## 2020-12-26 NOTE — Progress Notes (Signed)
65 yo with CAD, CHF, DM2, CKD and possible cirrhosis who follows with me in clinic.  Fluctuating creatinine in the past with NSAID use and borderline hypotension also known large bladder stones. His baseline creatinine is 1.5-2. Last week he saw his pcp and creatinine was 7.  I saw him in the office yesterday and he appears well without uremic symptoms but is having a lot of issues being able to void concerning for obstructi on. His daughter also stated often his blood pressure is as low as 70 systolic. He also apparently recently developed jaundice while taking Tylenol which had resolved when I saw him but LFTs remain elevated at 150-200 and I think there is concern for possible NASH cirrhosis.  Labs returned yesterday with creatinine about 7.5 but electrolytes reasonable. AKI likely obstruction versus ATN (given hypotension) but worsening heart fail ure and hepatorenal syndrome possible. Given the degree of his renal failure and complicated nature of his case I recommended he go to the ER for admission as well as renal imaging.

## 2020-12-26 NOTE — ED Notes (Signed)
Attempted to call report

## 2020-12-26 NOTE — ED Provider Notes (Signed)
[facility] Provider Note   CSN: [id number] Arrival date & time: 12/26/20  1123     History Chief Complaint  Patient presents with  . Flank Pain    Todd Mckee is a 65 y.o. male.  HPI 65 year old male with history of COPD, hypertension, hyperlipidemia, CAD, and CKD who presents emergency department from nephrology clinic due to concerns for acute renal failure and difficulty urinating.  Patient states that he has had difficulty urinating for over a week now.  The last urinate this morning.  States that whenever he urinates he feels like his "pee tube" seizes up on him and it causes pain.  Does not feel like he fully empties his bladder.  Denies history of episode like this previously.  Nothing makes it better, nothing makes it worse.  Has not taken anything for this.  Per nephrology note today, Dr. Fontaine No taken yesterday showed creatinine about 7.5, has a baseline creatinine of 1.5-2.  Concern was raised for obstructive uropathy versus ATN in the setting of reported hypotension from his daughter.  Patient sent here for admission and renal imaging per Dr. Joylene Grapes note.    Past Medical History:  Diagnosis Date  . Atypical chest pain 07/16/2020  . Claudication in peripheral vascular disease ([place]) 06/04/2020  . COPD (chronic obstructive pulmonary disease) ([place])   . Coronary artery disease   . Coronary artery disease involving native coronary artery of native heart without angina pectoris 03/22/2015   Formatting of this note might be different from the original. Non-drug-eluting stent to completely occluded right coronary artery in July 2013, non-drug-eluting stent to proximal LAD in July 2013  . Essential hypertension 03/22/2015  . H/O degenerative disc disease   . History of anemia due to chronic kidney disease   . Hyperlipidemia   . Hypertension   . Macular degeneration   . Myocardial infarct ([place]) 2013  . Old MI (myocardial infarction) 2013   . Peripheral edema   . Pure hypercholesterolemia 03/22/2015  . Renal insufficiency   . Seasonal allergic rhinitis   . Smoking 03/22/2015  . Tobacco use 07/16/2017    Patient Active Problem List   Diagnosis Date Noted  . Hypertension   . Coronary artery disease   . Atypical chest pain 07/16/2020  . Claudication in peripheral vascular disease ([place]) 06/04/2020  . Seasonal allergic rhinitis   . Renal insufficiency   . Peripheral edema   . Macular degeneration   . History of anemia due to chronic kidney disease   . H/O degenerative disc disease   . COPD (chronic obstructive pulmonary disease) ([place])   . Hyperlipidemia 07/16/2017  . Tobacco use 07/16/2017  . Essential hypertension 03/22/2015  . Coronary artery disease involving native coronary artery of native heart without angina pectoris 03/22/2015  . Smoking 03/22/2015  . Pure hypercholesterolemia 03/22/2015  . Old MI (myocardial infarction) 2013  . Myocardial infarct [facility]) 2013    Past Surgical History:  Procedure Laterality Date  . CORONARY ANGIOPLASTY WITH STENT PLACEMENT  2013       Family History  Problem Relation Age of Onset  . Arthritis Mother   . Thyroid disease Mother   . Heart disease Father   . Asthma Father   . Hypertension Father   . Thyroid disease Father   . Thyroid disease Brother     Social History   Tobacco Use  . Smoking status: Current Every Day Smoker  . Smokeless tobacco: Never Used  Substance Use Topics  .  Alcohol use: Never  . Drug use: Never    Home Medications Prior to Admission medications   Medication Sig Start Date End Date Taking? Authorizing Provider  albuterol (VENTOLIN HFA) 108 (90 Base) MCG/ACT inhaler Inhale 2 puffs into the lungs as needed for wheezing or shortness of breath. 03/25/20   [provider]  aspirin EC 81 MG tablet Take 81 mg by mouth daily. Swallow whole.    [provider]  empagliflozin (JARDIANCE) 10 MG TABS tablet Take 10 mg by mouth  daily.    [provider]  furosemide (LASIX) 20 MG tablet Take 1 tablet (20 mg total) by mouth daily. 11/01/20   Park Liter, MD  furosemide (LASIX) 40 MG tablet Take 40 mg by mouth 2 (two) times daily. 11/30/20   [provider]  glipiZIDE (GLUCOTROL) 5 MG tablet Take 5 mg by mouth daily. 12/25/20   [provider]  lisinopril (ZESTRIL) 2.5 MG tablet Take 2.5 mg by mouth daily.    [provider]  metoprolol tartrate (LOPRESSOR) 25 MG tablet Take 25 mg by mouth daily.    [provider]  rOPINIRole (REQUIP) 0.5 MG tablet Take 0.5 mg by mouth 2 (two) times daily. 06/22/20   [provider]  rosuvastatin (CRESTOR) 40 MG tablet Take 40 mg by mouth daily.    [provider]  terazosin (HYTRIN) 5 MG capsule Take 5 mg by mouth at bedtime. 03/25/20   [provider]    Allergies    Patient has no known allergies.  Review of Systems   Review of Systems  Constitutional: Negative for chills and fever.  HENT: Negative for ear pain and sore throat.   Eyes: Negative for pain and visual disturbance.  Respiratory: Negative for cough and shortness of breath.   Cardiovascular: Negative for chest pain and palpitations.  Gastrointestinal: Negative for abdominal pain and vomiting.  Genitourinary: Positive for difficulty urinating and dysuria. Negative for hematuria.  Musculoskeletal: Negative for arthralgias and back pain.  Skin: Negative for color change and rash.  Neurological: Negative for seizures and syncope.  All other systems reviewed and are negative.   Physical Exam Updated Vital Signs BP 122/63 (BP Location: Right Arm)   Pulse (!) 46   Temp 97.8 F (36.6 C) (Oral)   Resp 16   SpO2 100%   Physical Exam Vitals and nursing note reviewed.  Constitutional:      General: He is not in acute distress.    Appearance: He is well-developed.     Comments: Very pleasant and very hard of hearing.  HENT:     Head:  Normocephalic and atraumatic.     Right Ear: External ear normal.     Left Ear: External ear normal.     Nose: Nose normal.     Mouth/Throat:     Mouth: Mucous membranes are moist.  Eyes:     Extraocular Movements: Extraocular movements intact.     Conjunctiva/sclera: Conjunctivae normal.  Cardiovascular:     Rate and Rhythm: Normal rate and regular rhythm.     Heart sounds: Normal heart sounds. No murmur heard.   Pulmonary:     Effort: Pulmonary effort is normal. No respiratory distress.     Breath sounds: Normal breath sounds.  Abdominal:     General: Abdomen is flat.     Palpations: Abdomen is soft.     Tenderness: There is no abdominal tenderness. There is no guarding.     Comments: No suprapubic  fullness  Musculoskeletal:        General: Normal range of motion.     Cervical back: Normal range of motion and neck supple.     Right lower leg: No edema.     Left lower leg: No edema.  Skin:    General: Skin is warm and dry.     Capillary Refill: Capillary refill takes less than 2 seconds.  Neurological:     General: No focal deficit present.     Mental Status: He is alert and oriented to person, place, and time.  Psychiatric:        Mood and Affect: Mood normal.        Behavior: Behavior normal.     ED Results / Procedures / Treatments   Labs (all labs ordered are listed, but only abnormal results are displayed) Labs Reviewed  COMPREHENSIVE METABOLIC PANEL - Abnormal; Notable for the following components:      Result Value   Potassium 3.0 (*)    Glucose, Bld 118 (*)    BUN 87 (*)    Creatinine, Ser 7.76 (*)    Total Protein 6.2 (*)    AST 193 (*)    ALT 142 (*)    GFR, Estimated 7 (*)    All other components within normal limits  CBC - Abnormal; Notable for the following components:   RBC 3.37 (*)    Hemoglobin 10.8 (*)    HCT 32.2 (*)    All other components within normal limits  URINALYSIS, ROUTINE W REFLEX MICROSCOPIC - Abnormal; Notable for the following  components:   APPearance HAZY (*)    Glucose, UA >=500 (*)    Hgb urine dipstick LARGE (*)    Protein, ur 30 (*)    Bacteria, UA RARE (*)    All other components within normal limits  MAGNESIUM - Abnormal; Notable for the following components:   Magnesium 2.5 (*)    All other components within normal limits  PHOSPHORUS - Abnormal; Notable for the following components:   Phosphorus 5.5 (*)    All other components within normal limits  LIPASE, BLOOD    EKG None  Radiology CT ABDOMEN PELVIS WO CONTRAST  Result Date: 12/26/2020 CLINICAL DATA:  Flank pain. Urinary retention and dysuria. Possible kidney stone. Symptoms are bilateral. EXAM: CT ABDOMEN AND PELVIS WITHOUT CONTRAST TECHNIQUE: Multidetector CT imaging of the abdomen and pelvis was performed following the standard protocol without IV contrast. COMPARISON:  Renal ultrasound 11/04/2020 FINDINGS: Lower chest: Normal Hepatobiliary: Liver is normal without contrast. No calcified gallstones. Pancreas: Normal.  Duodenal diverticulum containing air. Spleen: Normal Adrenals/Urinary Tract: Adrenal glands are normal. Kidneys are normal in size. There is edema in the perirenal space that could go along with acute nephritis. No evidence of renal stone or mass. No hydroureteronephrosis. There are 2 large bladder stones dependent within the bladder, measuring up to 2.8 cm in size. Stomach/Bowel: Stomach and small intestine are normal. Moderate amount of fecal matter throughout the colon but no acute colon finding. Vascular/Lymphatic: Aortic atherosclerosis. No aneurysm. IVC is normal. No retroperitoneal adenopathy. Reproductive: Normal Other: No free fluid or air. Musculoskeletal: Minimal lumbar degenerative changes. IMPRESSION: No hydroureteronephrosis. Mild edema in the fat of the perirenal spaces, raising the possibility of nephritis. Two large bladder stones, the largest measuring 2.8 cm in diameter. Aortic Atherosclerosis (ICD10-I70.0).  Electronically Signed   By: Nelson Chimes M.D.   On: 12/26/2020 14:24    Procedures Procedures   Medications Ordered in  ED Medications - No data to display  ED Course  I have reviewed the triage vital signs and the nursing notes.  Pertinent labs & imaging results that were available during my care of the patient were reviewed by me and considered in my medical decision making (see chart for details).    MDM Rules/Calculators/A&P                          65 year old male presents emergency department for declining kidney function along with difficulty urinating.  He is well-appearing. Vital signs stable.   Exam shows well-appearing male.  Abdomen is soft and nontender.  Heart is regular rate and rhythm.  No peripheral edema.  Presentation concerning for acute renal failure in the setting of likely obstructive pathology.  Patient reports only being able to urinate in small amounts.  He does report history of bladder stones.  Patient's blood pressure has been stable here, less concern for ATN in the setting of possible hypotension.  Other differential includes dehydration, UTI, nephrolithiasis, interstitial nephritis.  Labs confirmed elevated creatinine.  Mild hypokalemia.  Uncertain of baseline.  Urinalysis shows large amount of glucose and blood with some protein.  No UTI.  CT shows large bladder stones, possible nephritis.  Nephrology consulted, they will come to evaluate patient at some point during his admission.  Patient admitted to family medicine service for further monitoring and treatment of acute renal failure.  No indication for emergent dialysis at this time.  Final Clinical Impression(s) / ED Diagnoses Final diagnoses:  Acute renal failure, unspecified acute renal failure type [facility])    Rx / [place] Orders ED Discharge Orders    None       Suzan Nailer, DO 12/26/20 1539    Carmin Muskrat, MD 12/26/20 [phone]

## 2020-12-26 NOTE — Consult Note (Signed)
Renal failure likely secondary to bladder outlet obstruction.  Large bladder with big stones within it.  Patient will need a foley catheter for an extended amount of time.  I would suggest that he be discharged with a catheter and follow-up with me for a voiding trial.  We will set this up.

## 2020-12-26 NOTE — Consult Note (Signed)
[facility] Renal Consultation Note  Requesting MD: Ardelia Mems Indication for Consultation: A on CRF  HPI:  Todd Mckee is a 65 y.o. male with past medical history significant for heavy tobacco use/abuse, hypercholesterolemia, diffuse vascular disease including CAD and PAD, COPD, question cirrhosis but with a normal-appearing liver on imaging.  He also has CKD, creatinine in December 2021 2.9-but reported from his nephrologist a baseline creatinine of between 1.5 and 2.  His medication list is also in question-Per his cardiology note last month he is on Jardiance, ACE inhibitor, Lasix and Hytrin.  Patient is also hard of hearing and a difficult historian.  His major issue is his he has had painful urination over the last month.  He feels like his urination stops and that is associated with pain but then resumes which alleviates the pain.  He saw his PCP last week and labs revealed a creatinine of 7.  Was sent to see his nephrologist Dr. Osborne Casco yesterday and creatinine again over 7.  Patient was sent for inpatient evaluation of these issues.  A CT was done today which did show bladder stones but no hydroureteronephrosis.  The other complaint the patient has is of syncope.  He is unable to tell me how long this has been going on.  He tells me that he has taken Aleve fairly recently as well.  Other than these 2 complaints he has been well.  He denies appetite disturbance or shortness of breath.  He is in a hurry to leave the hospital.  His urine is pretty bland with 30 of protein and no cells  Creatinine, Ser  Date/Time Value Ref Range Status  12/26/2020 11:32 AM 7.76 (H) 0.61 - 1.24 mg/dL Final  07/16/2020 05:00 PM 2.93 (H) 0.76 - 1.27 mg/dL Final     PMHx:   Past Medical History:  Diagnosis Date  . Atypical chest pain 07/16/2020  . Claudication in peripheral vascular disease ([place]) 06/04/2020  . COPD (chronic obstructive pulmonary disease) ([place])   . Coronary artery disease   .  Coronary artery disease involving native coronary artery of native heart without angina pectoris 03/22/2015   Formatting of this note might be different from the original. Non-drug-eluting stent to completely occluded right coronary artery in July 2013, non-drug-eluting stent to proximal LAD in July 2013  . Essential hypertension 03/22/2015  . H/O degenerative disc disease   . History of anemia due to chronic kidney disease   . Hyperlipidemia   . Hypertension   . Macular degeneration   . Myocardial infarct ([place]) 2013  . Old MI (myocardial infarction) 2013  . Peripheral edema   . Pure hypercholesterolemia 03/22/2015  . Renal insufficiency   . Seasonal allergic rhinitis   . Smoking 03/22/2015  . Tobacco use 07/16/2017    Past Surgical History:  Procedure Laterality Date  . CORONARY ANGIOPLASTY WITH STENT PLACEMENT  2013    Family Hx:  Family History  Problem Relation Age of Onset  . Arthritis Mother   . Thyroid disease Mother   . Heart disease Father   . Asthma Father   . Hypertension Father   . Thyroid disease Father   . Thyroid disease Brother     Social History:  reports that he has been smoking. He has never used smokeless tobacco. He reports that he does not drink alcohol and does not use drugs.  Allergies: No Known Allergies  Medications: Prior to Admission medications   Medication Sig Start Date End Date Taking? Authorizing  Provider  albuterol (VENTOLIN HFA) 108 (90 Base) MCG/ACT inhaler Inhale 2 puffs into the lungs as needed for wheezing or shortness of breath. 03/25/20  Yes [provider]  aspirin EC 81 MG tablet Take 81 mg by mouth daily. Swallow whole.   Yes [provider]  furosemide (LASIX) 40 MG tablet Take 40 mg by mouth 2 (two) times daily. 11/30/20  Yes [provider]  metoprolol tartrate (LOPRESSOR) 25 MG tablet Take 12.5 mg by mouth 2 (two) times daily.   Yes [provider]  rOPINIRole (REQUIP) 0.5 MG tablet Take 0.5 mg  by mouth 2 (two) times daily. 06/22/20  Yes [provider]  rosuvastatin (CRESTOR) 40 MG tablet Take 40 mg by mouth daily.   Yes [provider]  terazosin (HYTRIN) 5 MG capsule Take 5 mg by mouth at bedtime. 03/25/20  Yes [provider]  glipiZIDE (GLUCOTROL) 5 MG tablet Take 5 mg by mouth daily. Patient not taking: Reported on 12/26/2020 12/25/20   [provider]    I have reviewed the patient's current medications.  Labs:  Results for orders placed or performed during the hospital encounter of 12/26/20 (from the past 48 hour(s))  Lipase, blood     Status: None   Collection Time: 12/26/20 11:32 AM  Result Value Ref Range   Lipase 45 11 - 51 U/L    Comment: Performed at [facility], [address] [place], [place] [address]  Comprehensive metabolic panel     Status: Abnormal   Collection Time: 12/26/20 11:32 AM  Result Value Ref Range   Sodium 142 135 - 145 mmol/L   Potassium 3.0 (L) 3.5 - 5.1 mmol/L   Chloride 108 98 - 111 mmol/L   CO2 23 22 - 32 mmol/L   Glucose, Bld 118 (H) 70 - 99 mg/dL    Comment: Glucose reference range applies only to samples taken after fasting for at least 8 hours.   BUN 87 (H) 8 - 23 mg/dL   Creatinine, Ser 7.76 (H) 0.61 - 1.24 mg/dL   Calcium 8.9 8.9 - 10.3 mg/dL   Total Protein 6.2 (L) 6.5 - 8.1 g/dL   Albumin 3.8 3.5 - 5.0 g/dL   AST 193 (H) 15 - 41 U/L   ALT 142 (H) 0 - 44 U/L   Alkaline Phosphatase 47 38 - 126 U/L   Total Bilirubin 0.8 0.3 - 1.2 mg/dL   GFR, Estimated 7 (L) >60 mL/min    Comment: (NOTE) Calculated using the CKD-EPI Creatinine Equation (2021)    Anion gap 11 5 - 15    Comment: Performed at [facility], [place] [address] [place], [place] [address]  CBC     Status: Abnormal   Collection Time: 12/26/20 11:32 AM  Result Value Ref Range   WBC 9.8 4.0 - 10.5 K/uL   RBC 3.37 (L) 4.22 - 5.81 MIL/uL   Hemoglobin 10.8 (L) 13.0 - 17.0 g/dL   HCT 32.2 (L) 39.0 - 52.0 %   MCV 95.5 80.0 -  100.0 fL   MCH 32.0 26.0 - 34.0 pg   MCHC 33.5 30.0 - 36.0 g/dL   RDW 13.9 11.5 - 15.5 %   Platelets 156 150 - 400 K/uL   nRBC 0.0 0.0 - 0.2 %    Comment: Performed at [facility], [place] [address] [place], [place] [address]  Urinalysis, Routine w reflex microscopic Urine, Random     Status: Abnormal  Collection Time: 12/26/20 11:32 AM  Result Value Ref Range   Color, Urine YELLOW YELLOW   APPearance HAZY (A) CLEAR   Specific Gravity, Urine 1.008 1.005 - 1.030   pH 5.0 5.0 - 8.0   Glucose, UA >=500 (A) NEGATIVE mg/dL   Hgb urine dipstick LARGE (A) NEGATIVE   Bilirubin Urine NEGATIVE NEGATIVE   Ketones, ur NEGATIVE NEGATIVE mg/dL   Protein, ur 30 (A) NEGATIVE mg/dL   Nitrite NEGATIVE NEGATIVE   Leukocytes,Ua NEGATIVE NEGATIVE   RBC / HPF 0-5 0 - 5 RBC/hpf   WBC, UA 0-5 0 - 5 WBC/hpf   Bacteria, UA RARE (A) NONE SEEN   Squamous Epithelial / LPF 0-5 0 - 5   Mucus PRESENT    Amorphous Crystal PRESENT     Comment: Performed at [facility] [address], [place], [place] [address]  Magnesium     Status: Abnormal   Collection Time: 12/26/20 12:12 PM  Result Value Ref Range   Magnesium 2.5 (H) 1.7 - 2.4 mg/dL    Comment: Performed at [facility] [address], [place], [place] [address]  Phosphorus     Status: Abnormal   Collection Time: 12/26/20 12:12 PM  Result Value Ref Range   Phosphorus 5.5 (H) 2.5 - 4.6 mg/dL    Comment: Performed at [facility] [address], [place], [place] [address]     ROS:  A comprehensive review of systems was negative except for: Cardiovascular: positive for near-syncope and syncope Genitourinary: positive for intermittent pain with urination, decreased stream and hesitancy  Physical Exam: Vitals:   12/26/20 1317 12/26/20 1539  BP: 103/65 122/63  Pulse: (!) 46 (!) 46  Resp: 16 16  Temp: 97.8 F (36.6 C)   SpO2: 99% 100%     General: Obese, pale, simple, hard of hearing.  No acute distress HEENT: Pupils  are equal round reactive to light, extraocular motions are intact, mucous membranes are moist  Neck: No JVD Heart: Bradycardic Lungs: Mostly clear Abdomen: Soft, nontender, nondistended Extremities: Minimal edema Skin: Warm and dry Neuro: Alert, nonfocal  Assessment/Plan: 65 year old white male with diffuse vascular disease and moderate CKD.  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  Now has developed acute on chronic renal failure.  He has this urinary obstruction issue but not overt hydronephrosis or hydroureter on imaging.  I agree with Foley though and to get urology's input as this is the patient's largest complaint at this time.  From my standpoint, I am more concerned about his hypotension and near syncope episodes daily giving [place] an ATN type picture.  He also has some bradycardia.  For now holding all blood pressure medications and I will order some fluids as well as midodrine to increase blood pressure and improve renal perfusion.  There are no absolute indications for dialysis but renal function is quite abnormal.  We will need to follow closely in an inpatient setting 2. Hypertension/volume  -hypotension and bradycardia without significant volume overload.  Holding blood pressure medications giving fluids and midodrine 3.  Hypokalemia-we will give him an oral dose of potassium today 4. Anemia  -not significant at this time requiring any treatment 5.  Bladder stones- getting intermittent urinary obstruction and pain.  I have talked to primary team about getting urology involved   Louis Meckel 12/26/2020, 4:33 PM

## 2020-12-26 NOTE — H&P (Addendum)
[facility] Admission History and Physical Service Pager: [phone]  Patient name: Todd Mckee Medical record number: [id number] Date of birth: 03-Nov-1955 Age: 65 y.o. Gender: male  Primary Care Provider: Helen Hashimoto., MD Consultants: Nephrology, Urology Code Status:  FULL  Preferred Emergency Contact:  Contact Information    Name Relation Home Work [address]   Shiela Mayer Daughter [phone]  [phone]      Chief Complaint: B/l flank pain, difficulty urinating   Assessment and Plan: Todd Mckee is a 65 y.o. male presenting with acute renal failure likely 2/2 urinary obstruction. PMH is significant for HTN, HLD, DDD, CKD, COPD, CAD, MI   Acute Renal Failure on CKD stage 5? likely 2/2 urinary obstruction Patient presented to the ED at the recommendation of his nephrologist, Dr. Joylene Grapes, for acute kidney injury.  Per nephrologist, baseline creatinine is 1.5-2.  Unsure of baseline GFR.  He was seen by his PCP recently and creatinine had increased to 7.  In ED today, creatinine 7.7 . BUN increased at 87 but reassuringly no symptoms of uremia.  Low potassium but without critical electrolyte abnormalities or acidosis to indicate emergent dialysis.  Differential includes obstruction versus ATN.  Patient has reported hypotension at home, apparently has had systolics in 35'T.  In ED, vital stable with blood pressures ranging 732- 202 systolic.  UA with large hemoglobin.  CT imaging is notable for 2 large bladder stones (largest 2.8 cm), no hydroureternephrosis.  Suspect obstructive etiology from stones is more likely.  There is also concern for nephritis on imaging.  Normal WBC, afebrile, low concern for prostatitis or pyelonephritis.  Nephrology consulted in the ED. Was able to discuss case with urologist, Dr. Louis Meckel, who recommends that Foley be inserted for at least 10 days.  Believes the patient will have postobstructive diuresis with improvement in creatinine  over the next few days.  Urology will see patient for follow-up outpatient in a couple weeks for voiding trial.  We will admit patient for further observation, monitor diuresis post Foley insertion. -Admit to [facility] med telemetry, attending Dr. Ardelia Mems -Nephrology consulted, appreciate recommendations: fluids, midodrine -Urology recommendations: Leave Foley in for 10 days, patient to follow-up outpatient in a couple weeks for voiding trial. -Insert Foley catheter -Continue Terazosin 5 mg qhs -Strict I's and O's -Avoid nephrotoxic agents as able -PT/OT eval and treat  Hypotension  Hx Hypertension History of hypertension though recently more-so hypotensive.  Home medications include Lopressor 12.5 mg twice daily, Lasix 20 mg daily.  Patient has reportedly had low blood pressures at home.  Here he is soft-normotensive, ranging 542-706 systolic. 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  Unclear if patient has followed with GI for this.  CT abdomen pelvis without any abnormalities noted in the liver. RUQ ultrasound was normal.  Abdominal exam benign, patient is nonjaundiced. -Monitor with CMP -Avoid hepatotoxic agents as able -Hold statin -Hepatitis panel in am -CMP in am  Hypokalemia Potassium 3.0.  Likely secondary to diuretic use. -Replete 40 mEq x1 -Monitor with CMP  BPH Has been having difficulty urinating for quite some time.  Started on Terazosin 5 mg qhs. -Continue current medication -Foley inserted 05/26, to be kept in for 10 days per urology until outpatient f/u -Urology follow up outpatient  COPD: chronic, stable Longtime smoker.  Her medications only notable for albuterol.  Examination significant for increased expiratory phase. -  Continue Albuterol PRN   Type 2 DM: chronic  Unable to see patient's last hemoglobin A1c but has a documented  history of type 2 diabetes.  Home medications include glipizide and Jardiance. Glucose on BMP 118. Does have >500 glucose in urine but suspect from his home medication of Jardiance and Glipizide is contributing. -Holding home medications -Hemoglobin A1c in a.m. -Holding SSI and basal insulin for now  HFrEF Last Echo in 011/21.  LVEF 60-65% with normal left ventricular function. G2DD.  He was taking Lasix 40 mg BID but was recently decreased by his cardiologist to 20 mg daily for worsening hypotension. Recent decrease in Metoprolol 12.5 mg BID.  On exam he is euvolemic, no JVD elevation and lungs CTAB. -Hold Lasix in the setting soft BP -Hold Metoprolol as patient is bradycardic and soft BP.  Restart when able to tolerate -Strict I&O -Daily weight  CAD  Hx MI  RCA and LAD stent 2013  Follows with Dr. Agustin Cree. Last seen in the office 11/01/20. -Continue aspirin daily  -Holding beta blocker in the setting of bradycardia  Hyperlipidemia Home medication includes rosuvastatin 40 mg daily. -Holding statin in the setting of elevated LFT's and cirrhosis  Tobacco Abuse  Patient is a 1-1.5 ppd smoker x55 years. Fingers are stained yellow -Nicotine patch 21 g qd  -Encourage cessation  Hard Of Hearing Patient has significant decrease in hearing that has been ongoing for years.  No hearing aids.  Unable to check for cerumen impaction but can attempt later when patient moved to room. -Outpatient audiology for hearing aids -Speak in clear voice.  FEN/GI: Heart Healthy/Average Carb Prophylaxis: Subcutaneous Heparin  Disposition: Med-tele  History of Present Illness:  Todd Mckee is a 65 y.o. male presenting with bilateral flank pain and difficulty urinating for the last several months.   Patient states that he was advised to come in for evaluation by his nephrologist.  He tells me that for the last several months he is noticed he urinates more slowly.  Sometimes he will urinate normally and  then it will stop, it is "up-and-down".  He has some pain with this occasionally.  Reports that today he also had bilateral flank pain.  He denies seeing any blood in his urine.  He denies abdominal pain, chest pain, shortness of breath, fevers.  No history of similar symptoms in the past.  Otherwise, he feels well.    Review Of Systems: Per HPI with the following additions:   Review of Systems  Constitutional: Negative for fever.  Respiratory: Negative for shortness of breath.   Cardiovascular: Negative for chest pain.  Gastrointestinal: Negative for abdominal pain.  Genitourinary: Positive for difficulty urinating, dysuria and flank pain. Negative for hematuria.     Patient Active Problem List   Diagnosis Date Noted  . Hypertension   . Coronary artery disease   . Atypical chest pain 07/16/2020  . Claudication in peripheral vascular disease ([place]) 06/04/2020  . Seasonal allergic rhinitis   . Renal insufficiency   . Peripheral edema   . Macular degeneration   . History of anemia due to chronic kidney disease   . H/O degenerative disc disease   . COPD (chronic obstructive pulmonary disease) ([place])   . Hyperlipidemia 07/16/2017  . Tobacco use 07/16/2017  . Essential hypertension 03/22/2015  . Coronary artery disease involving native coronary artery of native heart without angina pectoris 03/22/2015  . Smoking 03/22/2015  . Pure hypercholesterolemia 03/22/2015  . Old MI (myocardial infarction) 2013  .  Myocardial infarct [facility]) 2013    Past Medical History: Past Medical History:  Diagnosis Date  . Atypical chest pain 07/16/2020  . Claudication in peripheral vascular disease ([place]) 06/04/2020  . COPD (chronic obstructive pulmonary disease) ([place])   . Coronary artery disease   . Coronary artery disease involving native coronary artery of native heart without angina pectoris 03/22/2015   Formatting of this note might be different from the original. Non-drug-eluting stent to completely  occluded right coronary artery in July 2013, non-drug-eluting stent to proximal LAD in July 2013  . Essential hypertension 03/22/2015  . H/O degenerative disc disease   . History of anemia due to chronic kidney disease   . Hyperlipidemia   . Hypertension   . Macular degeneration   . Myocardial infarct ([place]) 2013  . Old MI (myocardial infarction) 2013  . Peripheral edema   . Pure hypercholesterolemia 03/22/2015  . Renal insufficiency   . Seasonal allergic rhinitis   . Smoking 03/22/2015  . Tobacco use 07/16/2017    Past Surgical History: Past Surgical History:  Procedure Laterality Date  . CORONARY ANGIOPLASTY WITH STENT PLACEMENT  2013    Social History: Social History   Tobacco Use  . Smoking status: Current Every Day Smoker  . Smokeless tobacco: Never Used  Substance Use Topics  . Alcohol use: Never  . Drug use: Never   Additional social history: Lives at home with his mother.  Father has passed away.  Smokes 1-1.5 packs per day.  He has smoked since the age of 34. Please also refer to relevant sections of EMR.  Family History: Family History  Problem Relation Age of Onset  . Arthritis Mother   . Thyroid disease Mother   . Heart disease Father   . Asthma Father   . Hypertension Father   . Thyroid disease Father   . Thyroid disease Brother     Allergies and Medications: No Known Allergies No current facility-administered medications on file prior to encounter.   Current Outpatient Medications on File Prior to Encounter  Medication Sig Dispense Refill  . albuterol (VENTOLIN HFA) 108 (90 Base) MCG/ACT inhaler Inhale 2 puffs into the lungs as needed for wheezing or shortness of breath.    Marland Kitchen aspirin EC 81 MG tablet Take 81 mg by mouth daily. Swallow whole.    . empagliflozin (JARDIANCE) 10 MG TABS tablet Take 10 mg by mouth daily.    . furosemide (LASIX) 20 MG tablet Take 1 tablet (20 mg total) by mouth daily. 90 tablet 1  . furosemide (LASIX) 40 MG tablet Take 40  mg by mouth 2 (two) times daily.    Marland Kitchen glipiZIDE (GLUCOTROL) 5 MG tablet Take 5 mg by mouth daily.    Marland Kitchen lisinopril (ZESTRIL) 2.5 MG tablet Take 2.5 mg by mouth daily.    . metoprolol tartrate (LOPRESSOR) 25 MG tablet Take 25 mg by mouth daily.    Marland Kitchen rOPINIRole (REQUIP) 0.5 MG tablet Take 0.5 mg by mouth 2 (two) times daily.    . rosuvastatin (CRESTOR) 40 MG tablet Take 40 mg by mouth daily.    Marland Kitchen terazosin (HYTRIN) 5 MG capsule Take 5 mg by mouth at bedtime.      Objective: BP 103/65 (BP Location: Right Arm)   Pulse (!) 46   Temp 97.8 F (36.6 C) (Oral)   Resp 16   SpO2 99%  Exam: General: Chronically ill appearing, smells of tobacco Eyes: EOMI ENTM: Very hard of hearing, nares patent, poor dentition, dry  mucous membranes Neck: supple, without JVD Cardiovascular: Bradycardic, without murmur Respiratory: Increased expiratory phase, without wheezing/rhonchi/rales Gastrointestinal: Soft, nondistended, nontender in all quadrants, normoactive bowel sounds MSK: Able to move all extremities, without obvious deformities Derm: Lower extremities are cool to the touch, venous stasis dermatitis Neuro: Awake, alert, oriented, able to follow commands appropriately, without focal deficit Psych: Appropriate affect  Labs and Imaging: CBC BMET  Recent Labs  Lab 12/26/20 1132  WBC 9.8  HGB 10.8*  HCT 32.2*  PLT 156   Recent Labs  Lab 12/26/20 1132  NA 142  K 3.0*  CL 108  CO2 23  BUN 87*  CREATININE 7.76*  GLUCOSE 118*  CALCIUM 8.9     EKG: Sinus bradycardia rate 46 with 1st degree AV block  CT ABDOMEN PELVIS WO CONTRAST  Result Date: 12/26/2020 CLINICAL DATA:  Flank pain. Urinary retention and dysuria. Possible kidney stone. Symptoms are bilateral. EXAM: CT ABDOMEN AND PELVIS WITHOUT CONTRAST TECHNIQUE: Multidetector CT imaging of the abdomen and pelvis was performed following the standard protocol without IV contrast. COMPARISON:  Renal ultrasound 11/04/2020 FINDINGS: Lower chest:  Normal Hepatobiliary: Liver is normal without contrast. No calcified gallstones. Pancreas: Normal.  Duodenal diverticulum containing air. Spleen: Normal Adrenals/Urinary Tract: Adrenal glands are normal. Kidneys are normal in size. There is edema in the perirenal space that could go along with acute nephritis. No evidence of renal stone or mass. No hydroureteronephrosis. There are 2 large bladder stones dependent within the bladder, measuring up to 2.8 cm in size. Stomach/Bowel: Stomach and small intestine are normal. Moderate amount of fecal matter throughout the colon but no acute colon finding. Vascular/Lymphatic: Aortic atherosclerosis. No aneurysm. IVC is normal. No retroperitoneal adenopathy. Reproductive: Normal Other: No free fluid or air. Musculoskeletal: Minimal lumbar degenerative changes. IMPRESSION: No hydroureteronephrosis. Mild edema in the fat of the perirenal spaces, raising the possibility of nephritis. Two large bladder stones, the largest measuring 2.8 cm in diameter. Aortic Atherosclerosis (ICD10-I70.0). Electronically Signed   By: Nelson Chimes M.D.   On: 12/26/2020 14:24     Sharion Settler, DO 12/26/2020, 2:42 PM PGY-1, [facility] pager: [phone], text pages welcome   FPTS Upper-Level Resident Addendum   I have independently interviewed and examined the patient. I have discussed the above with the original author and agree with their documentation. Please see also any attending notes.    Carollee Leitz MD PGY-2, [facility] 12/26/2020 6:57 PM  [employer] pager: [phone] (text pages welcome through [facility])

## 2020-12-26 NOTE — ED Triage Notes (Signed)
Pt reports being sent here by pcp. Onset today of bilateral flank pain and difficulty urinating. Denies hx of kidney stones.

## 2020-12-27 DIAGNOSIS — N401 Enlarged prostate with lower urinary tract symptoms: Secondary | ICD-10-CM | POA: Diagnosis present

## 2020-12-27 DIAGNOSIS — F17210 Nicotine dependence, cigarettes, uncomplicated: Secondary | ICD-10-CM | POA: Diagnosis present

## 2020-12-27 DIAGNOSIS — E1122 Type 2 diabetes mellitus with diabetic chronic kidney disease: Secondary | ICD-10-CM | POA: Diagnosis present

## 2020-12-27 DIAGNOSIS — N179 Acute kidney failure, unspecified: Secondary | ICD-10-CM

## 2020-12-27 DIAGNOSIS — K7460 Unspecified cirrhosis of liver: Secondary | ICD-10-CM | POA: Diagnosis present

## 2020-12-27 DIAGNOSIS — E039 Hypothyroidism, unspecified: Secondary | ICD-10-CM | POA: Diagnosis present

## 2020-12-27 DIAGNOSIS — N189 Chronic kidney disease, unspecified: Secondary | ICD-10-CM | POA: Diagnosis not present

## 2020-12-27 DIAGNOSIS — Z20822 Contact with and (suspected) exposure to covid-19: Secondary | ICD-10-CM | POA: Diagnosis present

## 2020-12-27 DIAGNOSIS — I5022 Chronic systolic (congestive) heart failure: Secondary | ICD-10-CM | POA: Diagnosis present

## 2020-12-27 DIAGNOSIS — I2510 Atherosclerotic heart disease of native coronary artery without angina pectoris: Secondary | ICD-10-CM | POA: Diagnosis present

## 2020-12-27 DIAGNOSIS — N320 Bladder-neck obstruction: Secondary | ICD-10-CM | POA: Diagnosis present

## 2020-12-27 DIAGNOSIS — R7401 Elevation of levels of liver transaminase levels: Secondary | ICD-10-CM | POA: Diagnosis present

## 2020-12-27 DIAGNOSIS — N210 Calculus in bladder: Secondary | ICD-10-CM | POA: Diagnosis present

## 2020-12-27 DIAGNOSIS — J449 Chronic obstructive pulmonary disease, unspecified: Secondary | ICD-10-CM | POA: Diagnosis present

## 2020-12-27 DIAGNOSIS — R748 Abnormal levels of other serum enzymes: Secondary | ICD-10-CM | POA: Diagnosis present

## 2020-12-27 DIAGNOSIS — T502X5A Adverse effect of carbonic-anhydrase inhibitors, benzothiadiazides and other diuretics, initial encounter: Secondary | ICD-10-CM | POA: Diagnosis present

## 2020-12-27 DIAGNOSIS — N170 Acute kidney failure with tubular necrosis: Secondary | ICD-10-CM | POA: Diagnosis present

## 2020-12-27 DIAGNOSIS — Z720 Tobacco use: Secondary | ICD-10-CM | POA: Diagnosis not present

## 2020-12-27 DIAGNOSIS — I959 Hypotension, unspecified: Secondary | ICD-10-CM | POA: Diagnosis present

## 2020-12-27 DIAGNOSIS — E1151 Type 2 diabetes mellitus with diabetic peripheral angiopathy without gangrene: Secondary | ICD-10-CM | POA: Diagnosis present

## 2020-12-27 DIAGNOSIS — N185 Chronic kidney disease, stage 5: Secondary | ICD-10-CM | POA: Diagnosis present

## 2020-12-27 DIAGNOSIS — I132 Hypertensive heart and chronic kidney disease with heart failure and with stage 5 chronic kidney disease, or end stage renal disease: Secondary | ICD-10-CM | POA: Diagnosis present

## 2020-12-27 DIAGNOSIS — D696 Thrombocytopenia, unspecified: Secondary | ICD-10-CM | POA: Diagnosis present

## 2020-12-27 DIAGNOSIS — D631 Anemia in chronic kidney disease: Secondary | ICD-10-CM | POA: Diagnosis present

## 2020-12-27 DIAGNOSIS — I252 Old myocardial infarction: Secondary | ICD-10-CM | POA: Diagnosis not present

## 2020-12-27 DIAGNOSIS — E876 Hypokalemia: Secondary | ICD-10-CM | POA: Diagnosis present

## 2020-12-27 DIAGNOSIS — N138 Other obstructive and reflux uropathy: Secondary | ICD-10-CM | POA: Diagnosis present

## 2020-12-27 DIAGNOSIS — M6282 Rhabdomyolysis: Secondary | ICD-10-CM | POA: Diagnosis present

## 2020-12-27 LAB — CBC
HCT: 29.5 % — ABNORMAL LOW (ref 39.0–52.0)
Hemoglobin: 10 g/dL — ABNORMAL LOW (ref 13.0–17.0)
MCH: 31.9 pg (ref 26.0–34.0)
MCHC: 33.9 g/dL (ref 30.0–36.0)
MCV: 94.2 fL (ref 80.0–100.0)
Platelets: 130 10*3/uL — ABNORMAL LOW (ref 150–400)
RBC: 3.13 MIL/uL — ABNORMAL LOW (ref 4.22–5.81)
RDW: 14 % (ref 11.5–15.5)
WBC: 9.7 10*3/uL (ref 4.0–10.5)
nRBC: 0 % (ref 0.0–0.2)

## 2020-12-27 LAB — BASIC METABOLIC PANEL
Anion gap: 17 — ABNORMAL HIGH (ref 5–15)
BUN: 89 mg/dL — ABNORMAL HIGH (ref 8–23)
CO2: 17 mmol/L — ABNORMAL LOW (ref 22–32)
Calcium: 8.6 mg/dL — ABNORMAL LOW (ref 8.9–10.3)
Chloride: 111 mmol/L (ref 98–111)
Creatinine, Ser: 7.65 mg/dL — ABNORMAL HIGH (ref 0.61–1.24)
GFR, Estimated: 7 mL/min — ABNORMAL LOW (ref >60)
Glucose, Bld: 85 mg/dL (ref 70–99)
Potassium: 2.8 mmol/L — ABNORMAL LOW (ref 3.5–5.1)
Sodium: 145 mmol/L (ref 135–145)

## 2020-12-27 LAB — COMPREHENSIVE METABOLIC PANEL
ALT: 118 U/L — ABNORMAL HIGH (ref 0–44)
AST: 150 U/L — ABNORMAL HIGH (ref 15–41)
Albumin: 3.6 g/dL (ref 3.5–5.0)
Alkaline Phosphatase: 39 U/L (ref 38–126)
Anion gap: 10 (ref 5–15)
BUN: 88 mg/dL — ABNORMAL HIGH (ref 8–23)
CO2: 22 mmol/L (ref 22–32)
Calcium: 8.8 mg/dL — ABNORMAL LOW (ref 8.9–10.3)
Chloride: 112 mmol/L — ABNORMAL HIGH (ref 98–111)
Creatinine, Ser: 7.71 mg/dL — ABNORMAL HIGH (ref 0.61–1.24)
GFR, Estimated: 7 mL/min — ABNORMAL LOW (ref >60)
Glucose, Bld: 91 mg/dL (ref 70–99)
Potassium: 2.7 mmol/L — CL (ref 3.5–5.1)
Sodium: 144 mmol/L (ref 135–145)
Total Bilirubin: 0.9 mg/dL (ref 0.3–1.2)
Total Protein: 5.7 g/dL — ABNORMAL LOW (ref 6.5–8.1)

## 2020-12-27 LAB — HEPATITIS PANEL, ACUTE
HCV Ab: NONREACTIVE
Hep A IgM: NONREACTIVE
Hep B C IgM: NONREACTIVE
Hepatitis B Surface Ag: NONREACTIVE

## 2020-12-27 LAB — HEMOGLOBIN A1C
Hgb A1c MFr Bld: 6.4 % — ABNORMAL HIGH (ref 4.8–5.6)
Mean Plasma Glucose: 137 mg/dL

## 2020-12-27 LAB — CK: Total CK: 10930 U/L — ABNORMAL HIGH (ref 49–397)

## 2020-12-27 LAB — POTASSIUM: Potassium: 3.1 mmol/L — ABNORMAL LOW (ref 3.5–5.1)

## 2020-12-27 MED ORDER — POTASSIUM CHLORIDE CRYS ER 20 MEQ PO TBCR: 40.0000 meq | EXTENDED_RELEASE_TABLET | Freq: Once | ORAL | Status: DC

## 2020-12-27 MED ORDER — SODIUM CHLORIDE 0.9 % IV SOLN: INTRAVENOUS | Status: AC

## 2020-12-27 MED ORDER — POTASSIUM CHLORIDE CRYS ER 20 MEQ PO TBCR
40.0000 meq | EXTENDED_RELEASE_TABLET | Freq: Once | ORAL | Status: AC
  Administered 2020-12-27: 40 meq via ORAL
  Filled 2020-12-27: qty 2

## 2020-12-27 MED ORDER — POLYMYXIN B-TRIMETHOPRIM 10000-0.1 UNIT/ML-% OP SOLN
1.0000 [drp] | OPHTHALMIC | Status: AC
  Administered 2020-12-27 – 2020-12-30 (×14): 1 [drp] via OPHTHALMIC
  Filled 2020-12-27 (×2): qty 10

## 2020-12-27 MED ORDER — ARTIFICIAL TEARS OPHTHALMIC OINT
TOPICAL_OINTMENT | Freq: Every evening | OPHTHALMIC | Status: DC | PRN
  Filled 2020-12-27: qty 3.5

## 2020-12-27 MED ORDER — TAMSULOSIN HCL 0.4 MG PO CAPS
0.4000 mg | ORAL_CAPSULE | Freq: Every day | ORAL | Status: DC
  Administered 2020-12-27 – 2020-12-31 (×5): 0.4 mg via ORAL
  Filled 2020-12-27 (×5): qty 1

## 2020-12-27 MED ORDER — POTASSIUM CHLORIDE CRYS ER 20 MEQ PO TBCR
40.0000 meq | EXTENDED_RELEASE_TABLET | Freq: Three times a day (TID) | ORAL | Status: DC
  Administered 2020-12-27: 40 meq via ORAL
  Filled 2020-12-27: qty 2

## 2020-12-27 MED ORDER — CHLORHEXIDINE GLUCONATE CLOTH 2 % EX PADS
6.0000 | MEDICATED_PAD | Freq: Every day | CUTANEOUS | Status: DC
  Administered 2020-12-27 – 2020-12-31 (×5): 6 via TOPICAL

## 2020-12-27 NOTE — Evaluation (Signed)
Physical Therapy Evaluation Patient Details Name: Todd Mckee MRN: [id number] DOB: [birth year] Today's Date: 12/27/2020   History of Present Illness  The pt is a 65 yo male presenting 5/26 due to flank pain and difficulty urinating. Upon workup, pt found to have acute renal failure on CKD. PMH includes: COPD, CAD, HTN, HLD, HTN, MI.    Clinical Impression  Pt in bed upon arrival of PT, agreeable to evaluation at this time. Prior to admission the pt was mobilizing with use of RW for short distances in the home with supervision from his mother, and receiving assist for ADLs and IADLs. He reports his daughter is working to acquire a [facility] for the pt for longer distances. The pt now presents with limitations in functional mobility, strength, endurance, and dynamic stability in sitting and standing due to above dx, and will continue to benefit from skilled PT to address these deficits. The pt was able to demo good initial bed mobility, but does require minG-supervision with sit-stand transfers and short bout of ambulation in the room. The pt had no overt LOB, but was unable to tolerate any mobility outside of his BOS either in sitting or standing, and required BUE support to maintain standing without assist. The pt is eager to return home, and will need continued therapies and [place] aide to safely complete mobility and ADLs in the home. The pt will continue to benefit from skilled PT to progress functional endurance and stability to reduce risk of falls with return home.      Follow Up Recommendations Home health PT;Supervision for mobility/[facility] aide)    Equipment Recommendations  Wheelchair (measurements PT);Wheelchair cushion (measurements PT) (tub bench)    Recommendations for Other Services       Precautions / Restrictions Precautions Precautions: Fall Precaution Comments: pt reports one fall "reccently" unable to describe in more detail Restrictions Weight Bearing Restrictions: No       Mobility  Bed Mobility Overal bed mobility: Needs Assistance Bed Mobility: Supine to Sit     Supine to sit: Supervision     General bed mobility comments: supervision with use of bed rail, increased time but no assist to come to sitting or to adjust position once sitting EOB    Transfers Overall transfer level: Needs assistance Equipment used: Rolling walker (2 wheeled) Transfers: Sit to/from Stand Sit to Stand: Min guard         General transfer comment: minG for safety, VC for hand placement. once pt with correct hand placement he was able to complete sit-stand without physical assist. no overt LOB in standing when BUE supported. mild posterior lean  Ambulation/Gait Ambulation/Gait assistance: Min guard Gait Distance (Feet): 12 Feet Assistive device: Rolling walker (2 wheeled) Gait Pattern/deviations: Step-through pattern;Decreased stride length;Trunk flexed Gait velocity: decreased Gait velocity interpretation: <1.31 ft/sec, indicative of household ambulator General Gait Details: shortened stride with increased trunk flexion and heavy reliance on BUE support on RW. pt with no overt LOB, able to manage in tight spaces without cues.      Balance Overall balance assessment: Needs assistance Sitting-balance support: No upper extremity supported;Feet unsupported Sitting balance-Leahy Scale: Fair Sitting balance - Comments: pt unable to tolerate leaning outside BOS   Standing balance support: Bilateral upper extremity supported;During functional activity Standing balance-Leahy Scale: Poor Standing balance comment: reliant on BUE support                             Pertinent  Vitals/Pain Pain Assessment: No/denies pain    Home Living Family/patient expects to be discharged to:: Private residence Living Arrangements: Parent Available Help at Discharge: Family;Available PRN/intermittently (pt mother is 65yo) Type of Home: House Home Access: Level entry      Home Layout: One level Home Equipment: Walker - 2 wheels;Bedside commode (pt reports his daughter is getting him a WC)      Prior Function Level of Independence: Needs assistance   Gait / Transfers Assistance Needed: pt states he uses a RW in the home, needs a WC for longer distances outside of the home. reports his mother has been walking with him in the home reccently  ADL's / Homemaking Assistance Needed: pt reports his mom does "what she can" says they work together to complete ADLs and IADLs.        Hand Dominance        Extremity/Trunk Assessment   Upper Extremity Assessment Upper Extremity Assessment: Generalized weakness    Lower Extremity Assessment Lower Extremity Assessment: Generalized weakness    Cervical / Trunk Assessment Cervical / Trunk Assessment: Kyphotic  Communication   Communication: HOH  Cognition Arousal/Alertness: Awake/alert Behavior During Therapy: WFL for tasks assessed/performed Overall Cognitive Status: No family/caregiver present to determine baseline cognitive functioning                                 General Comments: pt able to follow all commands that he could hear. Demos good sequencing and awareness with slightly increased time. limited by [facility]      General Comments General comments (skin integrity, edema, etc.): VSS on RA, pt very HOH but agreeable    Exercises     Assessment/Plan    PT Assessment Patient needs continued PT services  PT Problem List Decreased strength;Decreased activity tolerance;Decreased balance;Decreased mobility       PT Treatment Interventions DME instruction;Gait training;Stair training;Functional mobility training;Therapeutic activities;Therapeutic exercise;Balance training;Patient/family education    PT Goals (Current goals can be found in the Care Plan section)  Acute Rehab PT Goals Patient Stated Goal: return home PT Goal Formulation: With patient Time For Goal Achievement:  01/10/21 Potential to Achieve Goals: Good    Frequency Min 3X/week    AM-PAC PT "6 Clicks" Mobility  Outcome Measure Help needed turning from your back to your side while in a flat bed without using bedrails?: None Help needed moving from lying on your back to sitting on the side of a flat bed without using bedrails?: None Help needed moving to and from a bed to a chair (including a wheelchair)?: A Little Help needed standing up from a chair using your arms (e.g., wheelchair or bedside chair)?: A Little Help needed to walk in hospital room?: A Little Help needed climbing 3-5 steps with a railing? : A Little 6 Click Score: 20    End of Session Equipment Utilized During Treatment: Gait belt Activity Tolerance: Patient tolerated treatment well Patient left: in chair;with call bell/phone within reach;with chair alarm set Nurse Communication: Mobility status PT Visit Diagnosis: Difficulty in walking, not elsewhere classified (R26.2);Muscle weakness (generalized) (M62.81)    Time: 3382-5053 PT Time Calculation (min) (ACUTE ONLY): 28 min   Charges:   PT Evaluation $PT Eval Low Complexity: 1 Low PT Treatments $Gait Training: 8-22 mins        Karma Ganja, PT, DPT   Acute Rehabilitation Department Pager #: [phone]  Otho Bellows 12/27/2020,  9:22 AM

## 2020-12-27 NOTE — Progress Notes (Signed)
[facility] Progress Note  Name: [place] MRN: [id number] DOB: 1955-11-19  Subjective:  had 2 liters oxygen over 5/26.  Had 40 meq K this am and was ordered for two more doses - cancelled.  He repeats questions back to me and laughs.  He cannot tell me if he has been on continuous fluids though these are ordered. Not running on my exam .  Review of systems:  Denies n/v Denies shortness of breath or chest pain  ------ Background on consult:  Ladislav Caselli is a 65 y.o. male with past medical history significant for heavy tobacco use/abuse, hypercholesterolemia, diffuse vascular disease including CAD and PAD, COPD, question cirrhosis but with a normal-appearing liver on imaging.  He also has CKD, creatinine in December 2021 2.9-but reported from his nephrologist a baseline creatinine of between 1.5 and 2.  His medication list is also in question-Per his cardiology note last month he is on Jardiance, ACE inhibitor, Lasix and Hytrin.  Patient is also hard of hearing and a difficult historian.  His major issue is his he has had painful urination over the last month.  He feels like his urination stops and that is associated with pain but then resumes which alleviates the pain.  He saw his PCP last week and labs revealed a creatinine of 7.  Was sent to see his nephrologist Dr. Osborne Casco yesterday and creatinine again over 7.  Patient was sent for inpatient evaluation of these issues.  A CT was done today which did show bladder stones but no hydroureteronephrosis.  The other complaint the patient has is of syncope.  He is unable to tell me how long this has been going on.  He tells me that he has taken Aleve fairly recently as well.  Other than these 2 complaints he has been well.  He denies appetite disturbance or shortness of breath.  He is in a hurry to leave the hospital.  His urine is pretty bland with 30 of protein and no cells   Intake/Output Summary (Last 24 hours) at 12/27/2020  0955 Last data filed at 12/27/2020 0300 Gross per 24 hour  Intake 673.75 ml  Output 2000 ml  Net -1326.25 ml    Vitals:  Vitals:   12/26/20 1857 12/26/20 2036 12/26/20 2238 12/27/20 0500  BP: 108/63 127/62 (!) 106/59   Pulse: (!) 49 (!) 49 (!) 50   Resp: 18 18 17    Temp:  98.2 F (36.8 C) 98.2 F (36.8 C)   TempSrc:  Oral Oral   SpO2: 96% 97% 96%   Weight:   88.3 kg 88.3 kg     Physical Exam:  General adult male in bed in no acute distress HEENT normocephalic atraumatic extraocular movements intact sclera anicteric Neck supple trachea midline Lungs clear to auscultation bilaterally normal work of breathing at rest  Heart S1S2 no rub Abdomen soft nontender nondistended Extremities no edema  Psych no agitation; question insight Neuro - awake and alert x 3 provides answers to questions after a delay - repeats them back to me.  Appears due to hard of hearing in part GU - foley catheter in place  Medications reviewed   Labs:  BMP Latest Ref Rng & Units 12/27/2020 12/26/2020 07/16/2020  Glucose 70 - 99 mg/dL 91 118(H) 103(H)  BUN 8 - 23 mg/dL 88(H) 87(H) 32(H)  Creatinine 0.61 - 1.24 mg/dL 7.71(H) 7.76(H) 2.93(H)  BUN/Creat Ratio 10 - 24 - - 11  Sodium 135 - 145 mmol/L 144 142  146(H)  Potassium 3.5 - 5.1 mmol/L 2.7(LL) 3.0(L) 4.1  Chloride 98 - 111 mmol/L 112(H) 108 107(H)  CO2 22 - 32 mmol/L 22 23 26   Calcium 8.9 - 10.3 mg/dL 8.8(L) 8.9 9.4     Assessment/Plan:   Assessment/Plan: 65 year old white male with diffuse vascular disease and moderate CKD.  He presents with acute on chronic renal failure in the setting of hypotension and also possible bladder obstruction  # AKI -  He has this urinary obstruction issue but not overt hydronephrosis or hydroureter on imaging; not improved with foley yet.  Also with hypotension and ischemic insults and likely with ischemic ATN - s/p foley  - appreciate urology - he should be discharged with a foley and see urology in follow-up  per note.  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  increase blood pressure and improve renal perfusion.  There are no absolute indications for dialysis but renal function is quite abnormal.  We will need to follow closely in an inpatient setting  # Hypotension - Holding blood pressure medications giving fluids and midodrine  # Hypokalemia- s/p potassium 40 meq once. redose once then repeat K then reassess additional dosing  # Anemia CKD - no indication for ESA   # Bladder stones- getting intermittent urinary obstruction and pain.  Urology consulted and for now they will see as an outpatient; foley in place   Disposition - continue monitoring; appropriate for inpatient   Claudia Desanctis, MD 12/27/2020 10:19 AM

## 2020-12-27 NOTE — Progress Notes (Signed)
Family Medicine Teaching Service Daily Progress Note Intern Pager: [phone]  Patient name: Todd Mckee Medical record number: [id number] Date of birth: [birth year] Age: 65 y.o. Gender: male  Primary Care Provider: Helen Hashimoto., MD Consultants: Nephrology, Urology (curb-sided) Code Status: FULL  Pt Overview and Major Events to Date:  5/26: Admitted, foley placed  Assessment and Plan: Todd Mckee is a 65 y.o. male who presented with acute renal failure 2/2 urinary bladder obstruction. PMH is significant for HTN, HLD, BPH, CKD, COPD, CAD, MI.   AKI on CKD Stage 5? 2/2 bladder outlet obstruction Foley catheter placed yesterday. Patient with 2L of UOP thus far. Creatinine with only slight improvement from 7.76 to 7.71. Though there is clearly an obstructive component to his AKI, Nephrology is also concerned that patient may have ATN contributing as well due to his history of hypotension and near syncope.  -Nephrology following, appreciate recommendations  -Midodrine added to help increase BP's and renal perfusion  -IVF at 100 mL/hr x24 hr  -Recommends formal Urology consult if no improvement 5/28  -May require HD should renal failure worsen or fail to improve -BMP daily; repeat BMP this afternoon -Foley will need to stay in place upon discharge  -Strict I/O  Hypotension  Hx Hypertension Reports of hypotension at home with recurrent near-syncopal episodes as well. Orthostasis likely contributing. Now on Midodrine and gentle fluids. BP's 103-127/59-65. -Continue IVF -Continue Midodrine 10 mg BID with meals -Holding home anti-hypertensive's  Hypokalemia Critical potassium of 2.7 this AM -Replete with 40 mEq x 2 doses -Repeat Potassium at 1300  Conjunctivitis Also with watery eyes but without itchiness. Examination notable for b/l conjunctival injection and yellow, mucous-like discharge from both eyes. Reassuring that he does not have pain.  -Polytrim 1 drop both eyes q4h x 3  days -Artificial tears PRN dry eyes at bedtime   Increased LFT's: Improving  RUQ U/S negative. Hepatitis panel still pending. AST 193>150, ALT 142>118. Abdominal exam benign.  -F/u hepatitis panel -Monitor with CMP  Normocytic Anemia Hgb 10, MCV 94.2. Unclear of baseline Hgb.  -Monitor with CBC -Consider further workup outpatient  COPD: chronic, stable -Continue Albuterol PRN  BPH: chronic, stable Home medication is Terazosin 5 mg qhs -Switch to Flomax given decreased orthostasis  -Foley to stay in place after discharge; outpatient voiding trial with Urology   Type 2 DM: chronic, stable Glucose on BMP 91. Hemoglobin A1c pending.  -F/u Hgb A1c  -Follow with BMP for now, glucoses have been stable on BMP thus far   CAD  Hx MI  RCA and LAD stent 2013  Follows with Dr. Agustin Cree -Continue ASA daily   Hyperlipidemia: chronic, stable -Holding statin in setting of elevated LFT's  Tobacco Abuse -Nicotine patch 21 g qd -Continue to encourage cessation   Hard of Hearing -Would benefit from outpatient audiology consult  FEN/GI: Heart healthy/carb modified  PPx: Subq heparin   Status is: Observation  The patient remains OBS appropriate and will d/c before 2 midnights.  Dispo: The patient is from: Home              Anticipated d/c is to: Home              Patient currently is not medically stable to d/c.   Difficult to place patient No    Subjective:  Patient feels well.  He is unable to tell me whether he has been able to urinate more.  Denies abdominal pain or pain with urination.   He is  wondering about the discharge coming from his eyes.  He has had this for "quite a while".  Denies pain or itchiness but does endorse watery eyes as well.  Objective: Temp:  [97.8 F (36.6 C)-98.2 F (36.8 C)] 98.2 F (36.8 C) (05/26 2238) Pulse Rate:  [46-50] 50 (05/26 2238) Resp:  [16-18] 17 (05/26 2238) BP: (103-127)/(59-65) 106/59 (05/26 2238) SpO2:  [96 %-100 %] 96 %  (05/26 2238) Weight:  [88.3 kg] 88.3 kg (05/26 2238) Physical Exam: General: smells of tobacco, very hard of hearing, in no apparent distress, edentulous Eyes: scleral injection b/l, b/l eyes with yellow mucous-appearing discharge, right eye was completely closed 2/2 discharge upon initial encounter Cardiovascular: RRR without murmur Respiratory: expiratory phase 2-3x inspiratory phase, inspiratory rhonchi at bases  Abdomen: soft, non-tender in all quadrants, non-distended without rebound/guarding Extremities: without edema, warm, able to palpate distal DP pulses       Laboratory: Recent Labs  Lab 12/26/20 1132  WBC 9.8  HGB 10.8*  HCT 32.2*  PLT 156   Recent Labs  Lab 12/26/20 1132  NA 142  K 3.0*  CL 108  CO2 23  BUN 87*  CREATININE 7.76*  CALCIUM 8.9  PROT 6.2*  BILITOT 0.8  ALKPHOS 47  ALT 142*  AST 193*  GLUCOSE 118*    Imaging/Diagnostic Tests: CT ABDOMEN PELVIS WO CONTRAST  Result Date: 12/26/2020 CLINICAL DATA:  Flank pain. Urinary retention and dysuria. Possible kidney stone. Symptoms are bilateral. EXAM: CT ABDOMEN AND PELVIS WITHOUT CONTRAST TECHNIQUE: Multidetector CT imaging of the abdomen and pelvis was performed following the standard protocol without IV contrast. COMPARISON:  Renal ultrasound 11/04/2020 FINDINGS: Lower chest: Normal Hepatobiliary: Liver is normal without contrast. No calcified gallstones. Pancreas: Normal.  Duodenal diverticulum containing air. Spleen: Normal Adrenals/Urinary Tract: Adrenal glands are normal. Kidneys are normal in size. There is edema in the perirenal space that could go along with acute nephritis. No evidence of renal stone or mass. No hydroureteronephrosis. There are 2 large bladder stones dependent within the bladder, measuring up to 2.8 cm in size. Stomach/Bowel: Stomach and small intestine are normal. Moderate amount of fecal matter throughout the colon but no acute colon finding. Vascular/Lymphatic: Aortic  atherosclerosis. No aneurysm. IVC is normal. No retroperitoneal adenopathy. Reproductive: Normal Other: No free fluid or air. Musculoskeletal: Minimal lumbar degenerative changes. IMPRESSION: No hydroureteronephrosis. Mild edema in the fat of the perirenal spaces, raising the possibility of nephritis. Two large bladder stones, the largest measuring 2.8 cm in diameter. Aortic Atherosclerosis (ICD10-I70.0). Electronically Signed   By: Nelson Chimes M.D.   On: 12/26/2020 14:24   US Abdomen Limited RUQ (LIVER/GB)  Result Date: 12/26/2020 CLINICAL DATA:  Transaminitis EXAM: ULTRASOUND ABDOMEN LIMITED RIGHT UPPER QUADRANT COMPARISON:  CT 12/26/2020, FINDINGS: Gallbladder: No gallstones or wall thickening visualized. No sonographic Murphy sign noted by sonographer. Common bile duct: Diameter: 3.6 mm Liver: No focal lesion identified. Within normal limits in parenchymal echogenicity. Portal vein is patent on color Doppler imaging with normal direction of blood flow towards the liver. Other: None. IMPRESSION: Negative examination Electronically Signed   By: Donavan Foil M.D.   On: 12/26/2020 17:56     Sharion Settler, DO 12/27/2020, 3:13 AM PGY-1, [place] Intern pager: [phone], text pages welcome

## 2020-12-27 NOTE — Evaluation (Signed)
Occupational Therapy Evaluation Patient Details Name: Todd Mckee MRN: [id number] DOB: 1955-09-20 Today's Date: 12/27/2020    History of Present Illness The pt is a 65 yo male presenting 5/26 due to flank pain and difficulty urinating. Upon workup, pt found to have acute renal failure on CKD. PMH includes: COPD, CAD, HTN, HLD, HTN, MI.   Clinical Impression   Pt presents with decline in function and safety with ADLs and ADL mobility with impaired strength, balance and endurance; pt HOH. PTA pt lived at home with his mother, used a RW for mobility, w/c for outside of home and was Ind with ADLs, with some assist from his mother for LB selfcare depending how he was feeling. Pt currently requires min A with LB ADLs, min guard A for grooming standing and min guard A with mobility using RW. Pt would benefit from acute OT services to address impairments to maximize level of function and safety     Follow Up Recommendations  Home health OT    Equipment Recommendations  Wheelchair (measurements OT);Wheelchair cushion (measurements OT);Other (comment) ([place] aide)    Recommendations for Other Services       Precautions / Restrictions Precautions Precautions: Fall Precaution Comments: pt reports thayhe has had one fall at home Restrictions Weight Bearing Restrictions: No      Mobility Bed Mobility               General bed mobility comments: pt in recliner upon arrival    Transfers Overall transfer level: Needs assistance Equipment used: Rolling walker (2 wheeled) Transfers: Sit to/from Stand Sit to Stand: Min guard         General transfer comment: cues for hand placement, safety    Balance Overall balance assessment: Needs assistance Sitting-balance support: No upper extremity supported;Feet unsupported Sitting balance-Leahy Scale: Fair     Standing balance support: Bilateral upper extremity supported;During functional activity Standing balance-Leahy Scale: Poor                              ADL either performed or assessed with clinical judgement   ADL Overall ADL's : Needs assistance/impaired Eating/Feeding: Independent;Sitting   Grooming: Wash/dry hands;Wash/dry face;Min guard;Standing   Upper Body Bathing: Set up;Sitting   Lower Body Bathing: Minimal assistance   Upper Body Dressing : Set up;Sitting   Lower Body Dressing: Minimal assistance   Toilet Transfer: Min guard;Ambulation;RW;Regular Toilet;Grab bars   Toileting- Clothing Manipulation and Hygiene: Min guard;Sit to/from stand       Functional mobility during ADLs: Min guard;Rolling walker;Cueing for safety       Vision Patient Visual Report: No change from baseline       Perception     Praxis      Pertinent Vitals/Pain Pain Assessment: No/denies pain     Hand Dominance Right   Extremity/Trunk Assessment Upper Extremity Assessment Upper Extremity Assessment: Generalized weakness   Lower Extremity Assessment Lower Extremity Assessment: Defer to PT evaluation   Cervical / Trunk Assessment Cervical / Trunk Assessment: Kyphotic   Communication Communication Communication: HOH   Cognition Arousal/Alertness: Awake/alert Behavior During Therapy: WFL for tasks assessed/performed Overall Cognitive Status: No family/caregiver present to determine baseline cognitive functioning                                 General Comments: limited by [facility]   General Comments  Exercises     Shoulder Instructions      Home Living Family/patient expects to be discharged to:: Private residence Living Arrangements: Parent Available Help at Discharge: Family;Available PRN/intermittently Type of Home: House Home Access: Level entry     Home Layout: One level     Bathroom Shower/Tub: Teacher, early years/pre: Standard     Home Equipment: Environmental consultant - 2 wheels;Bedside commode          Prior Functioning/Environment Level of  Independence: Needs assistance  Gait / Transfers Assistance Needed: pt states he uses a RW in the home, needs a WC for longer distances outside of the home. reports his mother has been walking with him in the home reccently ADL's / Homemaking Assistance Needed: pt reports his mom does "what she can" says they work together to complete ADLs and IADLs.            OT Problem List: Decreased strength;Impaired balance (sitting and/or standing);Decreased activity tolerance;Decreased knowledge of use of DME or AE      OT Treatment/Interventions: Self-care/ADL training;DME and/or AE instruction;Therapeutic activities;Patient/family education;Therapeutic exercise    OT Goals(Current goals can be found in the care plan section) Acute Rehab OT Goals Patient Stated Goal: go home OT Goal Formulation: With patient Time For Goal Achievement: 01/10/21 Potential to Achieve Goals: Good ADL Goals Pt Will Perform Grooming: with supervision;with set-up;standing Pt Will Perform Lower Body Bathing: with min guard assist;with supervision Pt Will Perform Lower Body Dressing: with min guard assist;with supervision Pt Will Transfer to Toilet: with supervision;with modified independence;ambulating Pt Will Perform Toileting - Clothing Manipulation and hygiene: with supervision;with modified independence;sit to/from stand  OT Frequency: Min 2X/week   Barriers to D/C:            Co-evaluation              AM-PAC OT "6 Clicks" Daily Activity     Outcome Measure Help from another person eating meals?: None Help from another person taking care of personal grooming?: A Little Help from another person toileting, which includes using toliet, bedpan, or urinal?: A Little Help from another person bathing (including washing, rinsing, drying)?: A Little Help from another person to put on and taking off regular upper body clothing?: None Help from another person to put on and taking off regular lower body  clothing?: A Little 6 Click Score: 20   End of Session Equipment Utilized During Treatment: Gait belt;Rolling walker  Activity Tolerance: Patient tolerated treatment well Patient left: in chair;with call bell/phone within reach  OT Visit Diagnosis: History of falling (Z91.81);Other abnormalities of gait and mobility (R26.89);Unsteadiness on feet (R26.81);Muscle weakness (generalized) (M62.81)                Time: 3382-5053 OT Time Calculation (min): 24 min Charges:  OT General Charges $OT Visit: 1 Visit OT Evaluation $OT Eval Low Complexity: 1 Low OT Treatments $Self Care/Home Management : 8-22 mins   Britt Bottom 12/27/2020, 2:37 PM

## 2020-12-27 NOTE — TOC Progression Note (Signed)
Transition of Care [facility]) - Progression Note    Patient Details  Name: Todd Mckee MRN: [id number] Date of Birth: [birth year]  Transition of Care [facility] CM/SW [place], RN Phone Number: [phone]  12/27/2020, 1:24 PM  Clinical Narrative:    CM called patient to discuss DME and HH needs. Patient states that he has a new wheelchair that his daughter needs to pick up so declines to have CM set up delivery. Patient states that he has wheelchair and several walkers at home. Patient wants to hold off on setting up [facility] right now stating that he already has one person that comes to the home now & that the MD has told him his kidneys are failing so he wants to wait before setting up [facility] therapies. TOC will continue to follow.          Expected Discharge Plan and Services                                                 Social Determinants of Health (SDOH) Interventions    Readmission Risk Interventions No flowsheet data found.

## 2020-12-28 ENCOUNTER — Encounter (HOSPITAL_COMMUNITY): Payer: Self-pay | Admitting: Family Medicine

## 2020-12-28 DIAGNOSIS — E039 Hypothyroidism, unspecified: Secondary | ICD-10-CM

## 2020-12-28 DIAGNOSIS — R7401 Elevation of levels of liver transaminase levels: Secondary | ICD-10-CM

## 2020-12-28 DIAGNOSIS — N210 Calculus in bladder: Secondary | ICD-10-CM | POA: Diagnosis not present

## 2020-12-28 DIAGNOSIS — Z720 Tobacco use: Secondary | ICD-10-CM

## 2020-12-28 DIAGNOSIS — N179 Acute kidney failure, unspecified: Secondary | ICD-10-CM | POA: Diagnosis not present

## 2020-12-28 LAB — COMPREHENSIVE METABOLIC PANEL
ALT: 110 U/L — ABNORMAL HIGH (ref 0–44)
AST: 133 U/L — ABNORMAL HIGH (ref 15–41)
Albumin: 3.4 g/dL — ABNORMAL LOW (ref 3.5–5.0)
Alkaline Phosphatase: 35 U/L — ABNORMAL LOW (ref 38–126)
Anion gap: 8 (ref 5–15)
BUN: 85 mg/dL — ABNORMAL HIGH (ref 8–23)
CO2: 22 mmol/L (ref 22–32)
Calcium: 8.4 mg/dL — ABNORMAL LOW (ref 8.9–10.3)
Chloride: 114 mmol/L — ABNORMAL HIGH (ref 98–111)
Creatinine, Ser: 7.05 mg/dL — ABNORMAL HIGH (ref 0.61–1.24)
GFR, Estimated: 8 mL/min — ABNORMAL LOW (ref >60)
Glucose, Bld: 87 mg/dL (ref 70–99)
Potassium: 3.8 mmol/L (ref 3.5–5.1)
Sodium: 144 mmol/L (ref 135–145)
Total Bilirubin: 0.6 mg/dL (ref 0.3–1.2)
Total Protein: 5.3 g/dL — ABNORMAL LOW (ref 6.5–8.1)

## 2020-12-28 LAB — CK: Total CK: 9325 U/L — ABNORMAL HIGH (ref 49–397)

## 2020-12-28 LAB — TSH: TSH: 154.007 u[IU]/mL — ABNORMAL HIGH (ref 0.350–4.500)

## 2020-12-28 LAB — GLUCOSE, CAPILLARY: Glucose-Capillary: 147 mg/dL — ABNORMAL HIGH (ref 70–99)

## 2020-12-28 MED ORDER — LEVOTHYROXINE SODIUM 50 MCG PO TABS
50.0000 ug | ORAL_TABLET | Freq: Every day | ORAL | Status: DC
  Administered 2020-12-29 – 2020-12-31 (×3): 50 ug via ORAL
  Filled 2020-12-28 (×3): qty 1

## 2020-12-28 NOTE — Progress Notes (Signed)
[facility] Progress Note  Name: Todd Mckee MRN: [id number] DOB: 1956-01-16  Subjective:  Had 2.3 liters UOP over 5/27.  Finds it odd that we all ask him orienting questions.   Has been on normal saline.  Now off on my exam.   Review of systems:  Denies n/v Denies shortness of breath or chest pain  ------ Background on consult:  Todd Mckee is a 65 y.o. male with past medical history significant for heavy tobacco use/abuse, hypercholesterolemia, diffuse vascular disease including CAD and PAD, COPD, question cirrhosis but with a normal-appearing liver on imaging.  He also has CKD, creatinine in December 2021 2.9-but reported from his nephrologist a baseline creatinine of between 1.5 and 2.  His medication list is also in question-Per his cardiology note last month he is on Jardiance, ACE inhibitor, Lasix and Hytrin.  Patient is also hard of hearing and a difficult historian.  His major issue is his he has had painful urination over the last month.  He feels like his urination stops and that is associated with pain but then resumes which alleviates the pain.  He saw his PCP last week and labs revealed a creatinine of 7.  Was sent to see his nephrologist Dr. Osborne Mckee yesterday and creatinine again over 7.  Patient was sent for inpatient evaluation of these issues.  A CT was done today which did show bladder stones but no hydroureteronephrosis.  The other complaint the patient has is of syncope.  He is unable to tell me how long this has been going on.  He tells me that he has taken Aleve fairly recently as well.  Other than these 2 complaints he has been well.  He denies appetite disturbance or shortness of breath.  He is in a hurry to leave the hospital.  His urine is pretty bland with 30 of protein and no cells   Intake/Output Summary (Last 24 hours) at 12/28/2020 0936 Last data filed at 12/28/2020 0840 Gross per 24 hour  Intake 2599.57 ml  Output 2300 ml  Net 299.57 ml    Vitals:   Vitals:   12/27/20 2045 12/28/20 0449 12/28/20 0558 12/28/20 0917  BP: 105/71 (!) 95/49 (!) 105/56 109/63  Pulse:  (!) 54 (!) 54 (!) 56  Resp:  18 19 19   Temp:  98.8 F (37.1 C)  97.7 F (36.5 C)  TempSrc:  Oral  Oral  SpO2:  93% 94% 93%  Weight:         Physical Exam:   General adult male in bed in no acute distress HEENT normocephalic atraumatic extraocular movements intact sclera anicteric Neck supple trachea midline Lungs clear to auscultation bilaterally unlabored on room air  Heart S1S2 no rub Abdomen soft nontender nondistended Extremities no edema appreciated  Psych no agitation; question his insight Neuro - awake and alert x 3 very hard of hearing GU - foley catheter in place  Medications reviewed   Labs:  BMP Latest Ref Rng & Units 12/28/2020 12/27/2020 12/27/2020  Glucose 70 - 99 mg/dL 87 85 -  BUN 8 - 23 mg/dL 85(H) 89(H) -  Creatinine 0.61 - 1.24 mg/dL 7.05(H) 7.65(H) -  BUN/Creat Ratio 10 - 24 - - -  Sodium 135 - 145 mmol/L 144 145 -  Potassium 3.5 - 5.1 mmol/L 3.8 2.8(L) 3.1(L)  Chloride 98 - 111 mmol/L 114(H) 111 -  CO2 22 - 32 mmol/L 22 17(L) -  Calcium 8.9 - 10.3 mg/dL 8.4(L) 8.6(L) -  Assessment/Plan:   Assessment/Plan: 65 year old white male with diffuse vascular disease and moderate CKD.  He presents with acute on chronic renal failure in the setting of hypotension and also possible bladder obstruction  # AKI -  He has this urinary obstruction issue but not overt hydronephrosis or hydroureter on imaging; not improved with foley yet.  Also with hypotension and ischemic insults and likely with ischemic ATN.  Does have elevated CKD at 9325 so component of rhabdo - s/p foley  - appreciate urology - he should be discharged with a foley and see urology in follow-up per note.  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  increase blood pressure and improve renal perfusion.  There are no absolute indications for dialysis but renal function is quite abnormal.  We will need to follow closely in an inpatient setting  # Hypotension - Holding blood pressure medications giving fluids and midodrine  # Hypokalemia- improved with repletion; caution with dosing K  # Anemia CKD - no indication for ESA. CBC in AM.   # Bladder stones- getting intermittent urinary obstruction and pain.  Urology consulted and for now they will see as an outpatient; foley in place   # Hypothyroidism  - per primary team  - TSH 154  Disposition - continue monitoring; appropriate for inpatient   Todd Desanctis, MD 12/28/2020 9:49 AM

## 2020-12-28 NOTE — Progress Notes (Signed)
Physical Therapy Treatment Patient Details Name: Todd Mckee MRN: [id number] DOB: June 11, 1956 Today's Date: 12/28/2020    History of Present Illness The pt is a 65 yo male presenting 5/26 due to flank pain and difficulty urinating. Upon workup, pt found to have acute renal failure on CKD. PMH includes: COPD, CAD, HTN, HLD, HTN, MI.    PT Comments    The pt was seen today for continued progression of OOB mobility and activity tolerance to maintain strength and mobility fr eventual d/c home. The pt was able to demo good progression of OOB mobility to include 125 ft of hallway ambulation with use of RW and minG for safety. The pt was able to take cues for positioning and management of RW well, and self-monitored for fatigue without cues. The pt remains very motivated to return home when medically cleared, will continue to benefit from skilled PT to progress functional power, strength, and dynamic stability both acutely and through HHPT following d/c to maximize functional independence and reduce risk of falls.    Follow Up Recommendations  Home health PT;Supervision for mobility/OOB [facility] aide)     Equipment Recommendations  Wheelchair (measurements PT);Wheelchair cushion (measurements PT) (tub bench)    Recommendations for Other Services       Precautions / Restrictions Precautions Precautions: Fall Precaution Comments: pt reports that he has had one fall at home Restrictions Weight Bearing Restrictions: No    Mobility  Bed Mobility Overal bed mobility: Needs Assistance Bed Mobility: Supine to Sit     Supine to sit: Supervision     General bed mobility comments: HOB elevated , but pt able to complete with increased time and effort without assist    Transfers Overall transfer level: Needs assistance Equipment used: Rolling walker (2 wheeled) Transfers: Sit to/from Stand Sit to Stand: Supervision         General transfer comment: no instability in  standing  Ambulation/Gait Ambulation/Gait assistance: Min guard Gait Distance (Feet): 125 Feet Assistive device: Rolling walker (2 wheeled) Gait Pattern/deviations: Step-through pattern;Decreased stride length;Trunk flexed Gait velocity: 0.33 m/s Gait velocity interpretation: <1.31 ft/sec, indicative of household ambulator General Gait Details: shortened stride with increased trunk flexion and heavy reliance on BUE support on RW. pt with no overt LOB, able to self-monitor fatigue. HR increased from 56 to 70s with gait. 2/4 DOE       Balance Overall balance assessment: Needs assistance Sitting-balance support: No upper extremity supported;Feet unsupported Sitting balance-Leahy Scale: Fair     Standing balance support: Bilateral upper extremity supported;During functional activity Standing balance-Leahy Scale: Poor Standing balance comment: reliant on BUE support for dynamic activity, able to static stand for sort bout without UE support                            Cognition Arousal/Alertness: Awake/alert Behavior During Therapy: WFL for tasks assessed/performed Overall Cognitive Status: No family/caregiver present to determine baseline cognitive functioning                                 General Comments: WFL but HOH      Exercises      General Comments General comments (skin integrity, edema, etc.): VSS on RA, very HOH      Pertinent Vitals/Pain Pain Assessment: No/denies pain           PT Goals (current goals can now be found in  the care plan section) Acute Rehab PT Goals Patient Stated Goal: go home PT Goal Formulation: With patient Time For Goal Achievement: 01/10/21 Potential to Achieve Goals: Good Progress towards PT goals: Progressing toward goals    Frequency    Min 3X/week      PT Plan Current plan remains appropriate       AM-PAC PT "6 Clicks" Mobility   Outcome Measure  Help needed turning from your back to your  side while in a flat bed without using bedrails?: None Help needed moving from lying on your back to sitting on the side of a flat bed without using bedrails?: None Help needed moving to and from a bed to a chair (including a wheelchair)?: A Little Help needed standing up from a chair using your arms (e.g., wheelchair or bedside chair)?: A Little Help needed to walk in hospital room?: A Little Help needed climbing 3-5 steps with a railing? : A Little 6 Click Score: 20    End of Session Equipment Utilized During Treatment: Gait belt Activity Tolerance: Patient tolerated treatment well Patient left: in bed;with bed alarm set;with call bell/phone within reach Nurse Communication: Mobility status PT Visit Diagnosis: Difficulty in walking, not elsewhere classified (R26.2);Muscle weakness (generalized) (M62.81)     Time: 8832-5498 PT Time Calculation (min) (ACUTE ONLY): 23 min  Charges:  $Gait Training: 23-37 mins                     Karma Ganja, PT, DPT   Acute Rehabilitation Department Pager #: [phone]   Otho Bellows 12/28/2020, 2:09 PM

## 2020-12-28 NOTE — Progress Notes (Addendum)
Family Medicine Teaching Service Daily Progress Note Intern Pager: [phone]  Patient name: Todd Mckee Medical record number: [id number] Date of birth: 1955-11-08 Age: 65 y.o. Gender: male  Primary Care Provider: Helen Mckee., MD Consultants: Nephrology, Urology (curb-sided) Code Status: FULL  Pt Overview and Major Events to Date:  5/26: Admitted, foley placed  Assessment and Plan: [name] a 65 y.o.malewho presented with acuterenal failure 2/2 urinary bladder obstruction. PMH is significant forHTN, HLD, BPH, CKD, COPD, CAD, MI.  AKI on CKD Stage 5 2/2 Bladder Outlet Obstruction, ?ATN, Rhabdo Had 2300 cc urine output yesterday.  Patient denies any pain or urinary symptoms at this time.  Creatinine is improving, creatinine 7.65>7.05. CK elevated at 9325, rhabdomyolysis also likely contributing. BP's have been soft, 235T systolic, continued on midodrine.  -Nephrology following, appreciate recommendations -Continue IV fluids 100 mL/hour -Continue midodrine 10 mg twice daily -Replete potassium with caution -Keep Foley in place -Strict I's/O  Hypotension, bradycardia  hypothyroidism TSH came back elevated at 154.  Patient also notes other symptoms consistent with hypothyroidism including feeling cold constantly, weight gain, and constipation.  Tells me today that he has always thought something had been wrong with his thyroid.  His thyroid feels enlarged on my examination.  Given his symptoms and largely elevated TSH, doubt subclinical hypothyroidism.  We will treat.  -Start Synthroid 50 mcg   Conjunctivitis -Continue Polytrim 1 drop both eyes q4h x2 days -Artificial tears PRN dry eyes at bedtime  Normocytic Anemia Likely anemia of chronic disease, stable.   COPD: chronic, stable -Continue Albuterol PRN  BPH: chronic, stable -Continue Flomax   Type 2 DM: chronic, stable Hgb A1c 6.4.   CAD  Hx MI and prior stents to RCA and LAD -ASA dialy  -Holding statin in  setting of elevated LFT's  Tobacco Abuse -Nicotine patch 21 g qd   Hard of Hearing -Audiology consult   FEN/GI: Heart healthy/carb modified PPx: Heparin subcutaneous    Status is: Inpatient  Remains inpatient appropriate because:Inpatient level of care appropriate due to severity of illness   Dispo: The patient is from: Home              Anticipated d/c is to: Home              Patient currently is not medically stable to d/c.   Difficult to place patient No   Subjective:  Patient feels well this morning.  He is wondering when he can go home.  He does understand that his kidneys are not doing well.  He tells me that he has no symptoms and feels well otherwise.  He is not surprised to hear that he has hypothyroidism as he is always felt that something was wrong with his thyroid.  States that he is always getting weak, always cold.  Also tells me that he has had problems with constipation for the last year.  Objective: Temp:  [98 F (36.7 C)-98.9 F (37.2 C)] 98.8 F (37.1 C) (05/28 0449) Pulse Rate:  [48-62] 54 (05/28 0558) Resp:  [17-19] 19 (05/28 0558) BP: (95-105)/(49-82) 105/56 (05/28 0558) SpO2:  [93 %-100 %] 94 % (05/28 0558) Physical Exam: General: Chronically ill appearing, smells of tobacco HEENT: Normocephalic, edentulous, thyroid feels enlarged but without any nodules Cardiovascular: RRR, no murmur Respiratory: Increased expiratory phase, CTAB  Abdomen: obese, non-tender, soft Extremities: No edema, 2+ DP pulses b/l  Laboratory: Recent Labs  Lab 12/26/20 1132 12/27/20 0401  WBC 9.8 9.7  HGB 10.8* 10.0*  HCT 32.2* 29.5*  PLT 156 130*   Recent Labs  Lab 12/26/20 1132 12/27/20 0401 12/27/20 1327  NA 142 144 145  K 3.0* 2.7* 2.8*  3.1*  CL 108 112* 111  CO2 23 22 17*  BUN 87* 88* 89*  CREATININE 7.76* 7.71* 7.65*  CALCIUM 8.9 8.8* 8.6*  PROT 6.2* 5.7*  --   BILITOT 0.8 0.9  --   ALKPHOS 47 39  --   ALT 142* 118*  --   AST 193* 150*  --    GLUCOSE 118* 91 85   Imaging/Diagnostic Tests: No results found.   Sharion Settler, DO 12/28/2020, 8:37 AM PGY-1, [place] Intern pager: [phone], text pages welcome

## 2020-12-28 NOTE — Plan of Care (Signed)
  Problem: Fluid Volume: Goal: Compliance with measures to maintain balanced fluid volume will improve Outcome: Progressing   Problem: Activity: Goal: Risk for activity intolerance will decrease Outcome: Progressing   Problem: Nutrition: Goal: Adequate nutrition will be maintained Outcome: Progressing   Problem: Elimination: Goal: Will not experience complications related to urinary retention Outcome: Progressing   Problem: Safety: Goal: Ability to remain free from injury will improve Outcome: Progressing   Problem: Pain Managment: Goal: General experience of comfort will improve Outcome: Progressing   Problem: Skin Integrity: Goal: Risk for impaired skin integrity will decrease Outcome: Progressing

## 2020-12-29 DIAGNOSIS — E039 Hypothyroidism, unspecified: Secondary | ICD-10-CM | POA: Diagnosis not present

## 2020-12-29 DIAGNOSIS — R7401 Elevation of levels of liver transaminase levels: Secondary | ICD-10-CM | POA: Diagnosis not present

## 2020-12-29 DIAGNOSIS — N179 Acute kidney failure, unspecified: Secondary | ICD-10-CM | POA: Diagnosis not present

## 2020-12-29 LAB — IRON AND TIBC
Iron: 104 ug/dL (ref 45–182)
Saturation Ratios: 42 % — ABNORMAL HIGH (ref 17.9–39.5)
TIBC: 245 ug/dL — ABNORMAL LOW (ref 250–450)
UIBC: 141 ug/dL

## 2020-12-29 LAB — FERRITIN: Ferritin: 201 ng/mL (ref 24–336)

## 2020-12-29 LAB — RENAL FUNCTION PANEL
Albumin: 3.2 g/dL — ABNORMAL LOW (ref 3.5–5.0)
Anion gap: 9 (ref 5–15)
BUN: 76 mg/dL — ABNORMAL HIGH (ref 8–23)
CO2: 21 mmol/L — ABNORMAL LOW (ref 22–32)
Calcium: 8.4 mg/dL — ABNORMAL LOW (ref 8.9–10.3)
Chloride: 113 mmol/L — ABNORMAL HIGH (ref 98–111)
Creatinine, Ser: 6.19 mg/dL — ABNORMAL HIGH (ref 0.61–1.24)
GFR, Estimated: 9 mL/min — ABNORMAL LOW (ref >60)
Glucose, Bld: 95 mg/dL (ref 70–99)
Phosphorus: 4 mg/dL (ref 2.5–4.6)
Potassium: 3.4 mmol/L — ABNORMAL LOW (ref 3.5–5.1)
Sodium: 143 mmol/L (ref 135–145)

## 2020-12-29 LAB — CBC
HCT: 28.2 % — ABNORMAL LOW (ref 39.0–52.0)
Hemoglobin: 9.3 g/dL — ABNORMAL LOW (ref 13.0–17.0)
MCH: 31.7 pg (ref 26.0–34.0)
MCHC: 33 g/dL (ref 30.0–36.0)
MCV: 96.2 fL (ref 80.0–100.0)
Platelets: 137 10*3/uL — ABNORMAL LOW (ref 150–400)
RBC: 2.93 MIL/uL — ABNORMAL LOW (ref 4.22–5.81)
RDW: 14.3 % (ref 11.5–15.5)
WBC: 9.2 10*3/uL (ref 4.0–10.5)
nRBC: 0 % (ref 0.0–0.2)

## 2020-12-29 LAB — GLUCOSE, CAPILLARY
Glucose-Capillary: 100 mg/dL — ABNORMAL HIGH (ref 70–99)
Glucose-Capillary: 118 mg/dL — ABNORMAL HIGH (ref 70–99)

## 2020-12-29 LAB — CK: Total CK: 6774 U/L — ABNORMAL HIGH (ref 49–397)

## 2020-12-29 MED ORDER — POTASSIUM CHLORIDE CRYS ER 20 MEQ PO TBCR
20.0000 meq | EXTENDED_RELEASE_TABLET | Freq: Once | ORAL | Status: AC
  Administered 2020-12-29: 20 meq via ORAL
  Filled 2020-12-29: qty 1

## 2020-12-29 MED ORDER — SODIUM CHLORIDE 0.9 % IV SOLN: INTRAVENOUS | Status: DC

## 2020-12-29 NOTE — Progress Notes (Signed)
[facility] Progress Note  Name: Todd Mckee MRN: [id number] DOB: 22-Sep-1955  Subjective:  Had 2 liters UOP over 5/28.  He's glad kidneys are starting to get a little better and we discussed that he would still need to be here for a few more days.   Review of systems:   Denies n/v Denies shortness of breath or chest pain  ------ Background on consult:  Todd Mckee is a 65 y.o. male with past medical history significant for heavy tobacco use/abuse, hypercholesterolemia, diffuse vascular disease including CAD and PAD, COPD, question cirrhosis but with a normal-appearing liver on imaging.  He also has CKD, creatinine in December 2021 2.9-but reported from his nephrologist a baseline creatinine of between 1.5 and 2.  His medication list is also in question-Per his cardiology note last month he is on Jardiance, ACE inhibitor, Lasix and Hytrin.  Patient is also hard of hearing and a difficult historian.  His major issue is his he has had painful urination over the last month.  He feels like his urination stops and that is associated with pain but then resumes which alleviates the pain.  He saw his PCP last week and labs revealed a creatinine of 7.  Was sent to see his nephrologist Dr. Osborne Casco yesterday and creatinine again over 7.  Patient was sent for inpatient evaluation of these issues.  A CT was done today which did show bladder stones but no hydroureteronephrosis.  The other complaint the patient has is of syncope.  He is unable to tell me how long this has been going on.  He tells me that he has taken Aleve fairly recently as well.  Other than these 2 complaints he has been well.  He denies appetite disturbance or shortness of breath.  He is in a hurry to leave the hospital.  His urine is pretty bland with 30 of protein and no cells   Intake/Output Summary (Last 24 hours) at 12/29/2020 1010 Last data filed at 12/29/2020 0900 Gross per 24 hour  Intake 1290.62 ml  Output 1975 ml  Net  -684.38 ml    Vitals:  Vitals:   12/28/20 1753 12/28/20 2051 12/29/20 0450 12/29/20 0935  BP: 104/61 (!) 94/56 102/62 119/60  Pulse: (!) 57 (!) 57 (!) 54 (!) 58  Resp: 18 18 17 19   Temp: 98.1 F (36.7 C) 98.3 F (36.8 C) (!) 97.5 F (36.4 C) 97.6 F (36.4 C)  TempSrc: Oral Oral Oral Oral  SpO2: 93% 95% 95% 93%  Weight:         Physical Exam:   General adult male in bed in no acute distress HEENT normocephalic atraumatic extraocular movements intact sclera anicteric Neck supple trachea midline Lungs clear to auscultation bilaterally unlabored on room air  Heart S1S2 no rub Abdomen soft nontender nondistended Extremities no edema  Psych no agitation; question his insight Neuro - awake and alert x 3 very hard of hearing GU - foley catheter in place  Medications reviewed   Labs:  BMP Latest Ref Rng & Units 12/29/2020 12/28/2020 12/27/2020  Glucose 70 - 99 mg/dL 95 87 85  BUN 8 - 23 mg/dL 76(H) 85(H) 89(H)  Creatinine 0.61 - 1.24 mg/dL 6.19(H) 7.05(H) 7.65(H)  BUN/Creat Ratio 10 - 24 - - -  Sodium 135 - 145 mmol/L 143 144 145  Potassium 3.5 - 5.1 mmol/L 3.4(L) 3.8 2.8(L)  Chloride 98 - 111 mmol/L 113(H) 114(H) 111  CO2 22 - 32 mmol/L 21(L) 22 17(L)  Calcium  8.9 - 10.3 mg/dL 8.4(L) 8.4(L) 8.6(L)     Assessment/Plan:   Assessment/Plan: 65 year old white male with diffuse vascular disease and moderate CKD.  He presents with acute on chronic renal failure in the setting of hypotension and also possible bladder obstruction  # AKI -  He has this urinary obstruction issue but not overt hydronephrosis or hydroureter on imaging; not improved with foley yet.  Also with hypotension and ischemic insults and likely with ischemic ATN.  Does have elevated CKD at 9325 so component of rhabdo - continue foley - appreciate urology - he should be discharged with a foley and see urology in follow-up per note.  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  increase blood pressure and improve renal perfusion.   # Hypotension - Holding blood pressure medications giving fluids and midodrine  # Hypokalemia-  Replete potassium 20 meq    # Anemia CKD - check iron stores.  May need ESA  # Bladder stones- getting intermittent urinary obstruction and pain.  Urology consulted and for now they will see as an outpatient; foley in place    # Hypothyroidism  - per primary team  - TSH 154  Disposition - continue monitoring; appropriate for inpatient   Claudia Desanctis, MD 12/29/2020 10:10 AM

## 2020-12-29 NOTE — Progress Notes (Signed)
Family Medicine Teaching Service Daily Progress Note Intern Pager: [phone]  Patient name: Todd Mckee Medical record number: [id number] Date of birth: 1955/09/29 Age: 65 y.o. Gender: male  Primary Care Provider: Helen Mckee., MD Consultants: Nephrology, Urology (curb-sided) Code Status: FULL  Pt Overview and Major Events to Date:  5/26: Admitted, foley placed  Assessment and Plan: Todd Mckee a 65 y.o.malewho presented with acuterenal failure 2/2 urinary bladder obstruction. PMH is significant forHTN, HLD, BPH, CKD, COPD, CAD, MI.  AKI on CKD Stage 5 2/2 Bladder Outlet Obstruction, ?ATN, Rhabdo Had 1975 cc urine output 5/29 AM.  Cr has been improving, creatinine 7.65>7.05. CK elevated at 9325, rhabdomyolysis also likely contributing. BP's have been soft, 431V systolic, continued on midodrine.  -Nephrology following, appreciate recommendations -Continue IV fluids 100 mL/hour -Continue midodrine 10 mg twice daily -Replete potassium with caution -Keep Foley in place -Strict I's/O -Cr was not drawn yet this morning when I checked on the patient, day team to follow up.   Hypotension, bradycardia  hypothyroidism TSH came back elevated at 154.  Patient also notes other symptoms consistent with hypothyroidism including feeling cold constantly, weight gain, and constipation.  Tells me today that he has always thought something had been wrong with his thyroid.  His thyroid feels enlarged on my examination.  Given his symptoms and largely elevated TSH, doubt subclinical hypothyroidism.  We will treat.  -Start Synthroid 50 mcg   Hypothyrodism: TSH 66 on 5/28.  -Synthroid 66mcg daily  Conjunctivitis, improving -Continue Polytrim 1 drop both eyes q4h x2 days -Artificial tears PRN dry eyes at bedtime  Normocytic Anemia Likely anemia of chronic disease, stable.   COPD: chronic, stable -Continue Albuterol PRN  BPH: chronic, stable -Continue Flomax   Type 2 DM: chronic,  stable Hgb A1c 6.4.   CAD  Hx MI and prior stents to RCA and LAD -ASA dailly  -Holding statin in setting of elevated LFT's  Tobacco Abuse -Nicotine patch 21 g qd   Hard of Hearing -Audiology consult   FEN/GI: Heart healthy/carb modified PPx: Heparin subcutaneous    Status is: Inpatient  Remains inpatient appropriate because:Inpatient level of care appropriate due to severity of illness   Dispo: The patient is from: Home              Anticipated d/c is to: Home              Patient currently is not medically stable to d/c.   Difficult to place patient No   Subjective:  Patient sleeping soundly when I see him, as labs have not been drawn this morning cannot give him any update about discharge. No questions at this time. Extremely hard of hearing.   Objective: Temp:  [97.7 F (36.5 C)-98.8 F (37.1 C)] 98.3 F (36.8 C) (05/28 2051) Pulse Rate:  [54-57] 57 (05/28 2051) Resp:  [18-19] 18 (05/28 2051) BP: (94-109)/(49-63) 94/56 (05/28 2051) SpO2:  [93 %-95 %] 95 % (05/28 2051) Physical Exam: General: Chronically ill appearing, pleasant patient HEENT: Normocephalic, edentulous Cardiovascular: RRR, no murmur Respiratory: Increased expiratory phase, CTAB, moving air well  Abdomen: obese, non-tender, soft Extremities: No edema, 2+ DP pulses b/l  Laboratory: Recent Labs  Lab 12/26/20 1132 12/27/20 0401  WBC 9.8 9.7  HGB 10.8* 10.0*  HCT 32.2* 29.5*  PLT 156 130*   Recent Labs  Lab 12/26/20 1132 12/27/20 0401 12/27/20 1327 12/28/20 0300  NA 142 144 145 144  K 3.0* 2.7* 2.8*  3.1* 3.8  CL 108  112* 111 114*  CO2 23 22 17* 22  BUN 87* 88* 89* 85*  CREATININE 7.76* 7.71* 7.65* 7.05*  CALCIUM 8.9 8.8* 8.6* 8.4*  PROT 6.2* 5.7*  --  5.3*  BILITOT 0.8 0.9  --  0.6  ALKPHOS 47 39  --  35*  ALT 142* 118*  --  110*  AST 193* 150*  --  133*  GLUCOSE 118* 91 85 87   Imaging/Diagnostic Tests: No results found.   Daisy Floro, DO 66/29/2022, 12:17  AM PGY-3, [place] Intern pager: [phone], text pages welcome

## 2020-12-29 NOTE — Plan of Care (Signed)
  Problem: Fluid Volume: Goal: Compliance with measures to maintain balanced fluid volume will improve Outcome: Progressing   

## 2020-12-30 DIAGNOSIS — N179 Acute kidney failure, unspecified: Secondary | ICD-10-CM | POA: Diagnosis not present

## 2020-12-30 DIAGNOSIS — N210 Calculus in bladder: Secondary | ICD-10-CM | POA: Diagnosis not present

## 2020-12-30 DIAGNOSIS — R7401 Elevation of levels of liver transaminase levels: Secondary | ICD-10-CM | POA: Diagnosis not present

## 2020-12-30 DIAGNOSIS — E039 Hypothyroidism, unspecified: Secondary | ICD-10-CM | POA: Diagnosis not present

## 2020-12-30 LAB — CBC
HCT: 27.6 % — ABNORMAL LOW (ref 39.0–52.0)
Hemoglobin: 9.2 g/dL — ABNORMAL LOW (ref 13.0–17.0)
MCH: 31.9 pg (ref 26.0–34.0)
MCHC: 33.3 g/dL (ref 30.0–36.0)
MCV: 95.8 fL (ref 80.0–100.0)
Platelets: 139 10*3/uL — ABNORMAL LOW (ref 150–400)
RBC: 2.88 MIL/uL — ABNORMAL LOW (ref 4.22–5.81)
RDW: 14.2 % (ref 11.5–15.5)
WBC: 10.2 10*3/uL (ref 4.0–10.5)
nRBC: 0 % (ref 0.0–0.2)

## 2020-12-30 LAB — RENAL FUNCTION PANEL
Albumin: 3.2 g/dL — ABNORMAL LOW (ref 3.5–5.0)
Anion gap: 6 (ref 5–15)
BUN: 66 mg/dL — ABNORMAL HIGH (ref 8–23)
CO2: 24 mmol/L (ref 22–32)
Calcium: 8.5 mg/dL — ABNORMAL LOW (ref 8.9–10.3)
Chloride: 114 mmol/L — ABNORMAL HIGH (ref 98–111)
Creatinine, Ser: 5.5 mg/dL — ABNORMAL HIGH (ref 0.61–1.24)
GFR, Estimated: 11 mL/min — ABNORMAL LOW (ref >60)
Glucose, Bld: 104 mg/dL — ABNORMAL HIGH (ref 70–99)
Phosphorus: 3.4 mg/dL (ref 2.5–4.6)
Potassium: 3.5 mmol/L (ref 3.5–5.1)
Sodium: 144 mmol/L (ref 135–145)

## 2020-12-30 LAB — SAVE SMEAR(SSMR), FOR PROVIDER SLIDE REVIEW

## 2020-12-30 LAB — GLUCOSE, CAPILLARY: Glucose-Capillary: 97 mg/dL (ref 70–99)

## 2020-12-30 MED ORDER — DARBEPOETIN ALFA 60 MCG/0.3ML IJ SOSY
60.0000 ug | PREFILLED_SYRINGE | INTRAMUSCULAR | Status: DC
  Administered 2020-12-30: 60 ug via SUBCUTANEOUS
  Filled 2020-12-30 (×2): qty 0.3

## 2020-12-30 NOTE — Progress Notes (Signed)
Patient ID: Todd Mckee, male   DOB: [birth year], 65 y.o.   MRN: [id number] [facility] Progress Note   Assessment/ Plan:   1.  Acute kidney injury on chronic kidney disease stage IV: Appears to be multifactorial with hypotension/possibly ischemic renal injury in combination with obstruction.  Nonoliguric overnight with improvement of BUN/creatinine noted and without any acute indications for dialysis.  Status post placement of Foley catheter and received saline overnight-we will discontinue intravenous fluids at this time while encouraging oral intake/continued sodium restriction.  2.  Bladder outlet obstruction secondary to large stones: Improved status post PRBC transfusion follow-up scheduled with urology as an outpatient. 3.  Anemia of chronic kidney disease: Without overt blood loss and likely secondary to chronic illness/kidney disease.  No indications for PRBC transfusion and I will order for a dose of Aranesp. 4.  Hypotension: Chronic and continue on midodrine at this time to improve systemic/renal perfusion.  Monitor status post initiation of tamsulosin.  Subjective:   Reports to be feeling fair and inquires about going home   Objective:   BP 107/64 (BP Location: Right Arm)   Pulse (!) 50   Temp 97.6 F (36.4 C) (Oral)   Resp 18   Wt 88.3 kg   SpO2 96%   BMI 27.93 kg/m   Intake/Output Summary (Last 24 hours) at 12/30/2020 0932 Last data filed at 12/30/2020 4132 Gross per 24 hour  Intake 1619.32 ml  Output 2080 ml  Net -460.68 ml   Weight change:   Physical Exam: Gen: Appears comfortable resting in bed, watching television CVS: Pulse regular rhythm, normal rate, S1 and S2 normal Resp: Decreased breath sounds over bases without significant rales or rhonchi Abd: Soft, obese, nontender, bowel sounds normal Ext: No lower extremity edema  Imaging: No results found.  Labs: BMET Recent Labs  Lab 12/26/20 1132 12/26/20 1212 12/27/20 0401 12/27/20 1327  12/28/20 0300 12/29/20 0713 12/30/20 0146  NA 142  --  144 145 144 143 144  K 3.0*  --  2.7* 2.8*  3.1* 3.8 3.4* 3.5  CL 108  --  112* 111 114* 113* 114*  CO2 23  --  22 17* 22 21* 24  GLUCOSE 118*  --  91 85 87 95 104*  BUN 87*  --  88* 89* 85* 76* 66*  CREATININE 7.76*  --  7.71* 7.65* 7.05* 6.19* 5.50*  CALCIUM 8.9  --  8.8* 8.6* 8.4* 8.4* 8.5*  PHOS  --  5.5*  --   --   --  4.0 3.4   CBC Recent Labs  Lab 12/26/20 1132 12/27/20 0401 12/29/20 0713 12/30/20 0146  WBC 9.8 9.7 9.2 10.2  HGB 10.8* 10.0* 9.3* 9.2*  HCT 32.2* 29.5* 28.2* 27.6*  MCV 95.5 94.2 96.2 95.8  PLT 156 130* 137* 139*   Medications:    . aspirin EC  81 mg Oral Daily  . Chlorhexidine Gluconate Cloth  6 each Topical Daily  . heparin  5,000 Units Subcutaneous Q8H  . levothyroxine  50 mcg Oral Q0600  . midodrine  10 mg Oral BID WC  . nicotine  21 mg Transdermal Daily  . rOPINIRole  0.5 mg Oral BID  . tamsulosin  0.4 mg Oral Daily  . trimethoprim-polymyxin b  1 drop Both Eyes Q4H   Elmarie Shiley, MD 12/30/2020, 9:32 AM

## 2020-12-30 NOTE — Progress Notes (Signed)
Occupational Therapy Treatment Patient Details Name: Todd Mckee MRN: [id number] DOB: 24-Jul-1956 Today's Date: 12/30/2020    History of present illness The pt is a 65 yo male presenting 5/26 due to flank pain and difficulty urinating. Upon workup, pt found to have acute renal failure on CKD. PMH includes: COPD, CAD, HTN, HLD, HTN, MI.   OT comments  Pt not receptive to recommendation of tub transfer bench or home health therapies. He is unsteady in standing during ADL. Demonstrated ability to don front opening gown and socks with set up, ambulate to bathroom and sink with min guard assist and complete pericare with min assist for standing balance.   Follow Up Recommendations  Home health OT    Equipment Recommendations  Wheelchair (measurements OT);Wheelchair cushion (measurements OT);Other (comment)    Recommendations for Other Services      Precautions / Restrictions Precautions Precautions: Fall       Mobility Bed Mobility               General bed mobility comments: pt in chair    Transfers Overall transfer level: Needs assistance Equipment used: Rolling walker (2 wheeled) Transfers: Sit to/from Stand Sit to Stand: Min guard         General transfer comment: min guard with first stand, supervision with subsequent    Balance Overall balance assessment: Needs assistance   Sitting balance-Leahy Scale: Good Sitting balance - Comments: no LOB donning socks in chair   Standing balance support: Single extremity supported Standing balance-Leahy Scale: Poor Standing balance comment: min assist for balance during standing pericare                           ADL either performed or assessed with clinical judgement   ADL Overall ADL's : Needs assistance/impaired     Grooming: Wash/dry hands;Standing;Supervision/safety           Upper Body Dressing : Set up;Sitting   Lower Body Dressing: Set up;Sitting/lateral leans Lower Body Dressing Details  (indicate cue type and reason): socks Toilet Transfer: Min guard;Ambulation;RW;BSC   Toileting- Clothing Manipulation and Hygiene: Minimal assistance;Sit to/from stand Toileting - Clothing Manipulation Details (indicate cue type and reason): assist for balance   Tub/Shower Transfer Details (indicate cue type and reason): educated in availability and use of tub bench, pt not willing to remove glass shower doors Functional mobility during ADLs: Min guard;Rolling walker       Vision       Perception     Praxis      Cognition Arousal/Alertness: Awake/alert Behavior During Therapy: WFL for tasks assessed/performed Overall Cognitive Status: No family/caregiver present to determine baseline cognitive functioning                                 General Comments: HOH, pt with decreased safety awareness        Exercises     Shoulder Instructions       General Comments      Pertinent Vitals/ Pain       Pain Assessment: No/denies pain  Home Living                                          Prior Functioning/Environment  Frequency  Min 2X/week        Progress Toward Goals  OT Goals(current goals can now be found in the care plan section)  Progress towards OT goals: Progressing toward goals  Acute Rehab OT Goals Patient Stated Goal: go home OT Goal Formulation: With patient Time For Goal Achievement: 01/10/21 Potential to Achieve Goals: Good  Plan Discharge plan remains appropriate    Co-evaluation                 AM-PAC OT "6 Clicks" Daily Activity     Outcome Measure   Help from another person eating meals?: None Help from another person taking care of personal grooming?: A Little Help from another person toileting, which includes using toliet, bedpan, or urinal?: A Little Help from another person bathing (including washing, rinsing, drying)?: A Little Help from another person to put on and taking off  regular upper body clothing?: None Help from another person to put on and taking off regular lower body clothing?: A Little 6 Click Score: 20    End of Session Equipment Utilized During Treatment: Gait belt;Rolling walker  OT Visit Diagnosis: History of falling (Z91.81);Other abnormalities of gait and mobility (R26.89);Unsteadiness on feet (R26.81);Muscle weakness (generalized) (M62.81)   Activity Tolerance Patient tolerated treatment well   Patient Left in chair;with call bell/phone within reach   Nurse Communication          Time: 6301-6010 OT Time Calculation (min): 38 min  Charges: OT General Charges $OT Visit: 1 Visit OT Treatments $Self Care/Home Management : 38-52 mins  Nestor Lewandowsky, OTR/L Acute Rehabilitation Services Pager: [phone] Office: [phone]   Malka So 12/30/2020, 3:22 PM

## 2020-12-30 NOTE — Discharge Summary (Addendum)
[facility] Discharge Summary  Patient name: Todd Mckee Medical record number: [id number] Date of birth: November 10, 1955 Age: 65 y.o. Gender: male Date of Admission: 12/26/2020  Date of Discharge: 12/31/2020 Admitting Physician: Sharion Settler, DO  Primary Care Provider: Helen Hashimoto., MD Consultants: Nephrology, Urology (curb-sided)  Indication for Hospitalization: AKI on CKD stage 5, bladder outlet obstruction  Discharge Diagnoses/Problem List:  Principal Problem:   Acute kidney injury superimposed on chronic kidney disease ([place]) Active Problems:   Tobacco use   Acute renal failure (ARF) ([place])   AKI (acute kidney injury) ([place])   Bladder stones   Hypothyroidism  Disposition: Home  Discharge Condition: Stable  Discharge Exam:  Blood pressure 107/61, pulse (!) 50, temperature 98.3 F (36.8 C), temperature source Oral, resp. rate 18, weight 88.3 kg, SpO2 95 %. General: Awake, alert, in no distress, edentulous, pleasant Cardio: RRR without murmur Resp: Increased expiratory phase, no wheezing, mild rhonchi at base, normal respiratory effort Abd: soft, non-tender in all quadrants, without rebound/guarding Ext: warm and dry, without edema  Brief Hospital Course:  Todd Mckee is a 65 y.o. male who presented with acute renal failure 2/2 urinary bladder obstruction. PMH is significant for HTN, HLD, BPH, CKD, COPD, CAD, MI. Below is a summary of his hospital course listed by problem.   AKI on CKD Stage 5, 2/2 bladder outlet obstruction Patient presented with creatinine elevated to 7.76 (baseline 1.5-2). CT imaging demonstrated two large bladder stones with the largest being 2.8 cm. There was also concern for nephritis on imaging but no signs of hydroureteronephrosis. Discussed with Urology who recommended foley catheter insertion for at least 10 days and follow up outpatient for voiding trial. Nephrology also consulted and had concerns of ATN given  patients history of hypotension and near-syncope at home. Patient was started on gentle hydration and midodrine to assist with renal perfusion. Patient was continued on trazosin. Throughout admission patient had net 3L of output. Creatinine had improved to 4.87 on discharge.   Hypotension Reportedly had been on-going at home prior to admission. In ED pressures abour 355'D systolics, 32'K diastolic. All anti-hypertensive medications held on admission. As stated above, patient was started on gentle hydration with IV NS below maintenance rate and midodrine 10 mg BID. Blood pressures maintained around low 025'K systolics and 27'C diastolic's with this.   Elevated LFT's AST/ALT elevated at 193/142 on admission. Work up with negative RUQ U/S and CT Abd/Pelv also without any hepatic abnormalities. Hepatitis panel negative. Hepatotoxic agents held during admission - such as statin. Also found to have elevated CK at 10,930 which down-trended with fluids and as kidney function improved. AST/ALT improved to 66/74 by day of discharge.  Type 2 DM Hgb A1c 6.4. Was not started on any medications. CBG's not routinely checked as glucose on BMP remained stable (90's-low 100's).   Hypothyroidism TSH elevated at 154. Started on Synthroid 50 mcg, tolerated well. Should have repeat TSH in 4-6 weeks.   Follow Up Recommendations: 1. Should keep foley in until follows up with Urology (6/10) for voiding trial.  2. Continue to encourage tobacco cessation. 3. Elevated LFT's with negative hepatitis panel, RUQ U/S. Repeat CMP, consider referral to GI if no improvement. 4. Statin was started back on discharge at decreased dose given elevation in LFT's. 5. Hypotensive, all anti-hypertensive's held at discharge. Started on Midodrine.  6. Started on Synthroid 50 mcg. Should have repeat TSH in 4-6 weeks.      Significant Procedures: None  Significant Labs  and Imaging:  Recent Labs  Lab 12/29/20 0713 12/30/20 0146  12/31/20 0239  WBC 9.2 10.2 9.0  HGB 9.3* 9.2* 9.0*  HCT 28.2* 27.6* 27.0*  PLT 137* 139* 134*   Recent Labs  Lab 12/26/20 1132 12/26/20 1212 12/27/20 0401 12/27/20 1327 12/28/20 0300 12/29/20 0713 12/30/20 0146 12/31/20 0239  NA 142  --  144 145 144 143 144 143  K 3.0*  --  2.7* 2.8*  3.1* 3.8 3.4* 3.5 3.3*  CL 108  --  112* 111 114* 113* 114* 114*  CO2 23  --  22 17* 22 21* 24 21*  GLUCOSE 118*  --  91 85 87 95 104* 94  BUN 87*  --  88* 89* 85* 76* 66* 58*  CREATININE 7.76*  --  7.71* 7.65* 7.05* 6.19* 5.50* 4.87*  CALCIUM 8.9  --  8.8* 8.6* 8.4* 8.4* 8.5* 8.5*  MG  --  2.5*  --   --   --   --   --   --   PHOS  --  5.5*  --   --   --  4.0 3.4 3.5  ALKPHOS 47  --  39  --  35*  --   --  38  AST 193*  --  150*  --  133*  --   --  66*  ALT 142*  --  118*  --  110*  --   --  74*  ALBUMIN 3.8  --  3.6  --  3.4* 3.2* 3.2* 3.2*  3.2*    CT Abdomen/Pelvis w/o Contrast 5/26 IMPRESSION: No hydroureteronephrosis. Mild edema in the fat of the perirenal spaces, raising the possibility of nephritis. Two large bladder stones, the largest measuring 2.8 cm in diameter. Aortic Atherosclerosis (ICD10-I70.0).  RUQ Abdominal U/S 5/26 IMPRESSION: Negative examination  Results/Tests Pending at Time of Discharge: None  Discharge Medications:  Allergies as of 12/31/2020   No Known Allergies     Medication List    STOP taking these medications   furosemide 40 MG tablet Commonly known as: LASIX   glipiZIDE 5 MG tablet Commonly known as: GLUCOTROL   metoprolol tartrate 25 MG tablet Commonly known as: LOPRESSOR   terazosin 5 MG capsule Commonly known as: HYTRIN     TAKE these medications   albuterol 108 (90 Base) MCG/ACT inhaler Commonly known as: VENTOLIN HFA Inhale 2 puffs into the lungs as needed for wheezing or shortness of breath.   artificial tears Oint ophthalmic ointment Commonly known as: LACRILUBE Place into both eyes at bedtime as needed for dry eyes.    aspirin EC 81 MG tablet Take 81 mg by mouth daily. Swallow whole.   levothyroxine 50 MCG tablet Commonly known as: SYNTHROID Take 1 tablet (50 mcg total) by mouth daily at 6 (six) AM.   midodrine 10 MG tablet Commonly known as: PROAMATINE Take 1 tablet (10 mg total) by mouth 2 (two) times daily with a meal.   rOPINIRole 0.5 MG tablet Commonly known as: REQUIP Take 0.5 mg by mouth 2 (two) times daily.   rosuvastatin 20 MG tablet Commonly known as: Crestor Take 1 tablet (20 mg total) by mouth daily. What changed:   medication strength  how much to take   tamsulosin 0.4 MG Caps capsule Commonly known as: FLOMAX Take 1 capsule (0.4 mg total) by mouth daily.            Durable Medical Equipment  (From admission, onward)  Start     Ordered   12/30/20 0921  For home use only DME Tub bench  Once        12/30/20 5397   12/30/20 0919  For home use only DME standard manual wheelchair with seat cushion  Once       Comments: Patient suffers from COPD, CAD, HTN, MI which impairs their ability to perform daily activities like bathing, dressing, and grooming in the home.  A walker will not resolve issue with performing activities of daily living. A wheelchair will allow patient to safely perform daily activities. Patient can safely propel the wheelchair in the home or has a caregiver who can provide assistance. Length of need Lifetime. Accessories: elevating leg rests (ELRs), wheel locks, extensions and anti-tippers.   12/30/20 6734          Discharge Instructions: Please refer to Patient Instructions section of EMR for full details.  Patient was counseled important signs and symptoms that should prompt return to medical care, changes in medications, dietary instructions, activity restrictions, and follow up appointments.   Follow-Up Appointments:  Follow-up Information    [name], MD. Go on 01/10/2021.   Specialty: Urology Why: Please go to your scheduled  appointment on 6/10 at 8:30AM. Contact information: [place]  [address] [phone]        Claudia Desanctis, MD. Go on 01/08/2021.   Specialty: Nephrology Why: Appointment at [facility] Contact information: [place] [address] [phone]        Helen Hashimoto., MD. Schedule an appointment as soon as possible for a visit.   Specialty: Internal Medicine Why: Please call to schedule an appointment for follow up and for repeat labs in 1 month. Contact information: [place] [address] [place], [place], DO 12/31/2020, 11:33 AM PGY-1, [place]

## 2020-12-30 NOTE — Progress Notes (Signed)
Family Medicine Teaching Service Daily Progress Note Intern Pager: [phone]  Patient name: Todd Mckee Medical record number: [id number] Date of birth: 10/15/1955 Age: 65 y.o. Gender: male  Primary Care Provider: Helen Hashimoto., MD Consultants: Nephrology Code Status: FULL  Pt Overview and Major Events to Date:  5/26: Admitted, foley placed   Assessment and Plan: Tony Granquist a 65 y.o.malewho presentedwith acuterenal failure 2/2 urinarybladderobstruction. PMH is significant forHTN, HLD,BPH, CKD, COPD, CAD, MI.  AKI on CKD Stage 5 Bladder Outlet Obstruction Foley still in place.  Patient has had good UOP, 1730L out yesterday.  Creatinine 6.19> 5.5 (baseline 1.5-2).  Electrolytes stable this morning. Anticipate that patient nearing discharge, would like to see improvement in creatinine off fluids.  -Nephrology following, appreciated recommendations -Keep foley in place -Strict I/O  -Continue midodrine 10 mg twice daily for renal perfusion -On IV fluids normal saline 75 mL/hr; can consider stopping fluids  -Renal function panel daily -Nutrition consult placed  Normocytic Anemia  Thrombocytopenia Hgb stable at 9.2, MCV 95.8. Platelets also stable 137>139. Iron panel notable for decreased TIBC 245, increased saturation ratio 42%, normal ferritin and iron. Would expect increased ferritin and decreased saturation/iron with anemia of chronic disease.    -CBC daily -F/u blood smear -Nephrology following: May need ESA  Hypothyroidism New diagnosis this admission with TSH 154.  Patient has been started on Synthroid this admission.  Still bradycardic in 50s-60s. -Continue Synthroid 50 mcg daily  BPH: chronic, stable -Continue Flomax   COPD: chronic, stable -Continue albuterol PRN  Type 2 DM: chronic, stable Hgb A1c 6.4  CAD  Hx MI and prior stents to RCA and LAD -ASA daily -Holding statin   Tobacco Abuse -Encourage cessation -Nicotine patch 21 g qd  Hard of  Hearing -Audiology consult outpatient      FEN/GI: Heart healthy/carb modified PPx: Subcutaneous heparin   Status is: Inpatient  Remains inpatient appropriate because:Inpatient level of care appropriate due to severity of illness   Dispo:  Patient From: Home  Planned Disposition: Home  Medically stable for discharge: No      Subjective:  Patient feels well this morning.  He is delighted to hear that his kidney function continues to improve.  He does looking forward to possibly going home soon.  He reports no difficulties or adverse effects with Synthroid.  Denies any urinary symptoms.  Objective: Temp:  [97.6 F (36.4 C)-98.3 F (36.8 C)] 97.6 F (36.4 C) (05/30 0539) Pulse Rate:  [54-61] 61 (05/30 0539) Resp:  [17-19] 17 (05/30 0539) BP: (102-119)/(60-65) 102/60 (05/30 0539) SpO2:  [93 %-96 %] 93 % (05/30 0539) Physical Exam: General: Awake, alert, in no distress, pleasant Cardiovascular: RRR, no murmur Respiratory: Diffuse rhonchi in all fields, increased expiratory phase, without wheezing Abdomen: Normoactive bowel sounds, soft, nontender in all quadrants Extremities: Without edema, warm and dry  Laboratory: Recent Labs  Lab 12/27/20 0401 12/29/20 0713 12/30/20 0146  WBC 9.7 9.2 10.2  HGB 10.0* 9.3* 9.2*  HCT 29.5* 28.2* 27.6*  PLT 130* 137* 139*   Recent Labs  Lab 12/26/20 1132 12/27/20 0401 12/27/20 1327 12/28/20 0300 12/29/20 0713 12/30/20 0146  NA 142 144   < > 144 143 144  K 3.0* 2.7*   < > 3.8 3.4* 3.5  CL 108 112*   < > 114* 113* 114*  CO2 23 22   < > 22 21* 24  BUN 87* 88*   < > 85* 76* 66*  CREATININE 7.76* 7.71*   < >  7.05* 6.19* 5.50*  CALCIUM 8.9 8.8*   < > 8.4* 8.4* 8.5*  PROT 6.2* 5.7*  --  5.3*  --   --   BILITOT 0.8 0.9  --  0.6  --   --   ALKPHOS 47 39  --  35*  --   --   ALT 142* 118*  --  110*  --   --   AST 193* 150*  --  133*  --   --   GLUCOSE 118* 91   < > 87 95 104*   < > = values in this interval not displayed.     Imaging/Diagnostic Tests: No results found.   Sharion Settler, DO 12/30/2020, 6:04 AM PGY-1, [place] Intern pager: [phone], text pages welcome

## 2020-12-31 ENCOUNTER — Other Ambulatory Visit (HOSPITAL_COMMUNITY): Payer: Self-pay

## 2020-12-31 LAB — RENAL FUNCTION PANEL
Albumin: 3.2 g/dL — ABNORMAL LOW (ref 3.5–5.0)
Anion gap: 8 (ref 5–15)
BUN: 58 mg/dL — ABNORMAL HIGH (ref 8–23)
CO2: 21 mmol/L — ABNORMAL LOW (ref 22–32)
Calcium: 8.5 mg/dL — ABNORMAL LOW (ref 8.9–10.3)
Chloride: 114 mmol/L — ABNORMAL HIGH (ref 98–111)
Creatinine, Ser: 4.87 mg/dL — ABNORMAL HIGH (ref 0.61–1.24)
GFR, Estimated: 12 mL/min — ABNORMAL LOW (ref >60)
Glucose, Bld: 94 mg/dL (ref 70–99)
Phosphorus: 3.5 mg/dL (ref 2.5–4.6)
Potassium: 3.3 mmol/L — ABNORMAL LOW (ref 3.5–5.1)
Sodium: 143 mmol/L (ref 135–145)

## 2020-12-31 LAB — HEPATIC FUNCTION PANEL
ALT: 74 U/L — ABNORMAL HIGH (ref 0–44)
AST: 66 U/L — ABNORMAL HIGH (ref 15–41)
Albumin: 3.2 g/dL — ABNORMAL LOW (ref 3.5–5.0)
Alkaline Phosphatase: 38 U/L (ref 38–126)
Bilirubin, Direct: 0.2 mg/dL (ref 0.0–0.2)
Indirect Bilirubin: 0.5 mg/dL (ref 0.3–0.9)
Total Bilirubin: 0.7 mg/dL (ref 0.3–1.2)
Total Protein: 5.3 g/dL — ABNORMAL LOW (ref 6.5–8.1)

## 2020-12-31 LAB — CBC
HCT: 27 % — ABNORMAL LOW (ref 39.0–52.0)
Hemoglobin: 9 g/dL — ABNORMAL LOW (ref 13.0–17.0)
MCH: 31.9 pg (ref 26.0–34.0)
MCHC: 33.3 g/dL (ref 30.0–36.0)
MCV: 95.7 fL (ref 80.0–100.0)
Platelets: 134 10*3/uL — ABNORMAL LOW (ref 150–400)
RBC: 2.82 MIL/uL — ABNORMAL LOW (ref 4.22–5.81)
RDW: 14.3 % (ref 11.5–15.5)
WBC: 9 10*3/uL (ref 4.0–10.5)
nRBC: 0 % (ref 0.0–0.2)

## 2020-12-31 LAB — PATHOLOGIST SMEAR REVIEW

## 2020-12-31 MED ORDER — ROSUVASTATIN CALCIUM 20 MG PO TABS
20.0000 mg | ORAL_TABLET | Freq: Every day | ORAL | 1 refills | Status: AC
  Filled 2020-12-31: qty 30, 30d supply, fill #0

## 2020-12-31 MED ORDER — MIDODRINE HCL 10 MG PO TABS
10.0000 mg | ORAL_TABLET | Freq: Two times a day (BID) | ORAL | 1 refills | Status: AC
  Filled 2020-12-31: qty 30, 15d supply, fill #0

## 2020-12-31 MED ORDER — LEVOTHYROXINE SODIUM 50 MCG PO TABS
50.0000 ug | ORAL_TABLET | Freq: Every day | ORAL | 1 refills | Status: AC
  Filled 2020-12-31: qty 30, 30d supply, fill #0

## 2020-12-31 MED ORDER — TAMSULOSIN HCL 0.4 MG PO CAPS
0.4000 mg | ORAL_CAPSULE | Freq: Every day | ORAL | 1 refills | Status: DC
  Filled 2020-12-31: qty 30, 30d supply, fill #0

## 2020-12-31 MED ORDER — ARTIFICIAL TEARS OPHTHALMIC OINT: TOPICAL_OINTMENT | Freq: Every evening | OPHTHALMIC | Status: DC | PRN

## 2020-12-31 MED ORDER — POTASSIUM CHLORIDE 20 MEQ PO PACK
20.0000 meq | PACK | Freq: Once | ORAL | Status: AC
  Administered 2020-12-31: 20 meq via ORAL
  Filled 2020-12-31: qty 1

## 2020-12-31 NOTE — Discharge Instructions (Signed)
Dear Todd Mckee,  Thank you for letting us participate in your care. You were hospitalized for a bladder obstruction that caused some damage to your kidney. This improved after we placed a foley catheter. We expect that the obstruction was due to the stones in your bladder. You will need to keep the foley catheter in place until you are seen by the Urologist. You have an appointment with them on 6/10 at 8:30 AM.  We also started you a new medication for your thyroid, since you were found to have hypothyroidism. Please continue to take this daily and follow up with your doctor. You will need to have your thyroid level rechecked in 4-6 weeks by your primary care physician and they may adjust your medication at that time.  We recommend that you stop your blood pressure medications until seen by your primary care provider.  POST-HOSPITAL & CARE INSTRUCTIONS 1. Your foley catheter will need to stay in until you follow up with Urology.  2. Go to your follow up appointments (listed below)   DOCTOR'S APPOINTMENT   No future appointments.  Follow-up Information    [name], MD. Go on 01/10/2021.   Specialty: Urology Why: Please go to your scheduled appointment on 6/10 at 8:30AM. Contact information: [place] [address] [phone]        Claudia Desanctis, MD. Go on 01/08/2021.   Specialty: Nephrology Why: Appointment at [facility] Contact information: [place] [address] [phone]        Helen Hashimoto., MD. Schedule an appointment as soon as possible for a visit.   Specialty: Internal Medicine Why: Please call to schedule an appointment for follow up and for repeat labs in 1 month. Contact information: [place] [address] [phone]               Take care and be well!  [facility]  [place], [place] [address] [phone]  ________________________________________________________________________________________   Chronic Kidney Disease Stage 5 Tips for People not on Dialysis Receiving Conservative, Supportive, or Medical Care  If you feel that dialysis and transplant are not for you, there are other options to manage the disease. This approach is called conservative, supportive, or medical care. As kidney disease progresses, there is an increase in uremia (waste products that build up in your blood). It is possible to manage symptoms of uremia--including loss of appetite, weight loss, nausea, and vomiting--with diet and medication.  Tips: Each symptom of uremia can be managed with different diet strategies. The symptoms are listed below with associated tips for each symptom to help manage your disease.   Poor Appetite/Weight Loss: Eat small, frequent meals throughout the day instead of three big meals. Choose foods that are most appealing to you. Eat small meals and avoid skipping meals. Choose foods with full fat content and avoid foods marketed as low-fat or no-fat. Add healthy fats like olive oil to foods for added calories. Use nutrition supplements such as protein powders (whey protein, pea protein) in smoothies or store-bought beverages.  High Blood Potassium Levels (hyperkalemia): Choose more low-potassium fruits and vegetables daily. Limit the high-potassium fruit and vegetables you eat daily. Limit meat servings sizes: to 3 ounces or the size of the palm of your hand. Limit daily dairy servings to 4 ounces or  cup milk, 1  ounce of cheese (about the size of your thumb), or  cup yogurt. Choose low-potassium juice, such as apple or grape. Limit smoothies, and meal replacement beverages with more than 200 milligrams per serving. Energy drinks are generally not recommended because they often have added potassium.  Fluid Overload: Keep track of all  fluids you drink in a day. Fluids include all beverages plus anything that is liquid at room temperature, such as soup, ice cream, sorbet, gelatin, popsicles, and ice. Use small cups to make sure you only drink small amounts throughout the day.  Dry mouth: Reduce sodium/salt intake to less than 2300 milligrams per day. Read labels to check how much sodium is in your foods. Avoid adding salt to your foods. Keep lips and mouth moist by rinsing your mouth, brushing your teeth, or using a spray bottle with water. Artificial saliva products are also available. Add lemon juice to your beverages. Suck on sugar free hard mints, sour candy, or frozen fruit. Chew gum  Changes in taste:  Rinse your mouth with sodium bicarbonate mouthwash regularly. Use herbs, spices, or foods with tart, sweet, or strong flavors. Use cold temperature foods and allow food to cool before eating. Use plastic or bamboo utensils instead of metal.  Nausea, dry heaves, and vomiting: Eat small meals or snacks throughout the day. Avoid skipping meals or eating large meals as this may worsen nausea. If the smell of food makes you nauseated, stay out of the room where food is being prepared. Rinse regularly with sodium bicarbonate mouthwash. Dry, bland and room temperature foods may be better tolerated. Use ginger products like ginger tea, ginger ale, and ginger candy to relieve nausea.  Constipation:  Eat small meals or snacks throughout the day. Avoid fried and high fat foods. Eat high-fiber foods including fruits, vegetables, and whole grains like oatmeal. Participate in regular physical activity as you are able. Discuss with your doctor before starting any over the counter supplements (high fiber or probiotic supplements).

## 2020-12-31 NOTE — Care Management Important Message (Signed)
Important Message  Patient Details  Name: Todd Mckee MRN: [id number] Date of Birth: [birth year]   Medicare Important Message Given:  Yes     Barb Merino Eiza Canniff 12/31/2020, 1:01 PM

## 2020-12-31 NOTE — Progress Notes (Addendum)
Patient ID: Todd Mckee, male   DOB: 15-Jun-1956, 65 y.o.   MRN: [id number] [facility] Progress Note   Assessment/ Plan:   1.  Acute kidney injury on chronic kidney disease stage IV: Appears to be multifactorial with hypotension/possibly ischemic renal injury in combination with obstruction.  Nonoliguric overnight with continued improvement of renal function and no indications for dialysis/intervention for acute electrolyte abnormalities.  Stable from a renal standpoint to discharge home and has been scheduled for follow-up with nephrology-Dr. Joylene Grapes on 01/09/2021 at 3PM. 2.  Bladder outlet obstruction secondary to large stones: Improved status post placement of Foley catheter/initiation of tamsulosin and scheduled to follow-up with urology as an outpatient. 3.  Anemia of chronic kidney disease: Without overt blood loss and likely secondary to chronic illness/kidney disease.  No indications for PRBC transfusion and received Aranesp yesterday. 4.  Hypotension: Chronic and continue on midodrine at this time to improve systemic/renal perfusion.  Monitor status post initiation of tamsulosin.  Subjective:   Reports that he continues to feel better and denies any chest pain or shortness of breath   Objective:   BP (!) 103/53 (BP Location: Left Arm)   Pulse (!) 53   Temp 98.6 F (37 C) (Oral)   Resp 20   Wt 88.3 kg   SpO2 97%   BMI 27.93 kg/m   Intake/Output Summary (Last 24 hours) at 12/31/2020 0855 Last data filed at 12/31/2020 0544 Gross per 24 hour  Intake 300 ml  Output 1450 ml  Net -1150 ml   Weight change:   Physical Exam: Gen: Appears comfortable sitting up in bed, watching television CVS: Pulse regular rhythm, normal rate, S1 and S2 normal Resp: Decreased breath sounds over bases without significant rales or rhonchi Abd: Soft, obese, nontender, bowel sounds normal Ext: No lower extremity edema  Imaging: No results found.  Labs: BMET Recent Labs  Lab  12/26/20 1132 12/26/20 1212 12/27/20 0401 12/27/20 1327 12/28/20 0300 12/29/20 0713 12/30/20 0146 12/31/20 0239  NA 142  --  144 145 144 143 144 143  K 3.0*  --  2.7* 2.8*  3.1* 3.8 3.4* 3.5 3.3*  CL 108  --  112* 111 114* 113* 114* 114*  CO2 23  --  22 17* 22 21* 24 21*  GLUCOSE 118*  --  91 85 87 95 104* 94  BUN 87*  --  88* 89* 85* 76* 66* 58*  CREATININE 7.76*  --  7.71* 7.65* 7.05* 6.19* 5.50* 4.87*  CALCIUM 8.9  --  8.8* 8.6* 8.4* 8.4* 8.5* 8.5*  PHOS  --  5.5*  --   --   --  4.0 3.4 3.5   CBC Recent Labs  Lab 12/27/20 0401 12/29/20 0713 12/30/20 0146 12/31/20 0239  WBC 9.7 9.2 10.2 9.0  HGB 10.0* 9.3* 9.2* 9.0*  HCT 29.5* 28.2* 27.6* 27.0*  MCV 94.2 96.2 95.8 95.7  PLT 130* 137* 139* 134*   Medications:    . aspirin EC  81 mg Oral Daily  . Chlorhexidine Gluconate Cloth  6 each Topical Daily  . darbepoetin (ARANESP) injection - NON-DIALYSIS  60 mcg Subcutaneous Q Mon-1800  . heparin  5,000 Units Subcutaneous Q8H  . levothyroxine  50 mcg Oral Q0600  . midodrine  10 mg Oral BID WC  . nicotine  21 mg Transdermal Daily  . rOPINIRole  0.5 mg Oral BID  . tamsulosin  0.4 mg Oral Daily   Elmarie Shiley, MD 12/31/2020, 8:55 AM

## 2020-12-31 NOTE — Care Management (Signed)
    Durable Medical Equipment  (From admission, onward)         Start     Ordered   12/30/20 0921  For home use only DME Tub bench  Once        12/30/20 9753   12/30/20 0919  For home use only DME standard manual wheelchair with seat cushion  Once       Comments: Patient suffers from COPD, CAD, HTN, MI which impairs their ability to perform daily activities like bathing, dressing, and grooming in the home.  A walker will not resolve issue with performing activities of daily living. A wheelchair will allow patient to safely perform daily activities. Patient can safely propel the wheelchair in the home or has a caregiver who can provide assistance. Length of need Lifetime. Accessories: elevating leg rests (ELRs), wheel locks, extensions and anti-tippers.   12/30/20 0051

## 2020-12-31 NOTE — Care Management (Signed)
Spoke w patient's daughter and patient over the phone. HH declined, requested WCa nd 3/1. Orders placed, will deliver to the room.

## 2020-12-31 NOTE — Progress Notes (Signed)
DISCHARGE NOTE HOME Uziah Sorter to be discharged home per MD order. Discussed prescriptions and follow up appointments with the patient. Prescriptions given to patient; medication list explained in detail. Patient verbalized understanding.  Skin clean, dry and intact without evidence of skin break down, no evidence of skin tears noted. IV catheter discontinued intact. Site without signs and symptoms of complications. Dressing and pressure applied. Pt denies pain at the site currently. No complaints noted.  Patient free of lines, drains, and wounds.   An After Visit Summary (AVS) was printed and given to the patient. Patient escorted via wheelchair, and discharged home via private auto.  Seaborn Nakama S Timisha Mondry, RN

## 2020-12-31 NOTE — Progress Notes (Signed)
Physical Therapy Treatment Patient Details Name: Todd Mckee MRN: [id number] DOB: [birth year] Today's Date: 12/31/2020    History of Present Illness The pt is a 65 yo male presenting 5/26 due to flank pain and difficulty urinating. Upon workup, pt found to have acute renal failure on CKD. PMH includes: COPD, CAD, HTN, HLD, HTN, MI.    PT Comments    Pt agreeable to ambulation with therapy. Limited in safe mobility by R knee buckling in presence of decreased strength, balance and endurance. Pt is supervision for bed mobility and min guard for transfers and min guard for ambulation with RW except for 1x bout of R knee buckling requiring modA for steadying. Educated on proper RW usage especially proximity to RW to assist in self steadying with knee buckling. Pt verbalizes understanding. D/c plans remain appropriately. PT will continue to follow acutely.     Follow Up Recommendations  Home health PT;Supervision for mobility/[facility] aide)     Equipment Recommendations  Wheelchair (measurements PT);Wheelchair cushion (measurements PT) (tub bench)       Precautions / Restrictions Precautions Precautions: Fall Precaution Comments: pt reports that he has had one fall at home Restrictions Weight Bearing Restrictions: No    Mobility  Bed Mobility Overal bed mobility: Needs Assistance Bed Mobility: Supine to Sit     Supine to sit: Supervision     General bed mobility comments: HOB elevated , but pt able to complete with increased time and effort without assist    Transfers Overall transfer level: Needs assistance Equipment used: Rolling walker (2 wheeled) Transfers: Sit to/from Stand Sit to Stand: Min guard         General transfer comment: min guard for power up and steadying  Ambulation/Gait Ambulation/Gait assistance: Min guard;Mod assist Gait Distance (Feet): 100 Feet Assistive device: Rolling walker (2 wheeled) Gait Pattern/deviations: Step-through pattern;Decreased  stride length;Trunk flexed;Shuffle;Decreased step length - right;Decreased step length - left   Gait velocity interpretation: <1.31 ft/sec, indicative of household ambulator General Gait Details: shortened step length, with trunk flexed, constant vc for proximity to RW especially with turning and for upright posture, 1x major LoB requiring modA for steadying after R knee buckling, pt reports "it just goes sometimes"          Balance Overall balance assessment: Needs assistance Sitting-balance support: No upper extremity supported;Feet unsupported Sitting balance-Leahy Scale: Fair     Standing balance support: Bilateral upper extremity supported;During functional activity Standing balance-Leahy Scale: Poor Standing balance comment: reliant on BUE support for dynamic activity, able to static stand for sort bout without UE support                            Cognition Arousal/Alertness: Awake/alert Behavior During Therapy: WFL for tasks assessed/performed Overall Cognitive Status: No family/caregiver present to determine baseline cognitive functioning                                 General Comments: WFL but HOH      Exercises      General Comments General comments (skin integrity, edema, etc.): VSS on RA, HOH      Pertinent Vitals/Pain Pain Assessment: No/denies pain           PT Goals (current goals can now be found in the care plan section) Acute Rehab PT Goals Patient Stated Goal: go home PT Goal Formulation: With patient Time  For Goal Achievement: 01/10/21 Potential to Achieve Goals: Good Progress towards PT goals: Not progressing toward goals - comment (limited by knee buckling today)    Frequency    Min 3X/week      PT Plan Current plan remains appropriate       AM-PAC PT "6 Clicks" Mobility   Outcome Measure  Help needed turning from your back to your side while in a flat bed without using bedrails?: None Help needed  moving from lying on your back to sitting on the side of a flat bed without using bedrails?: None Help needed moving to and from a bed to a chair (including a wheelchair)?: A Little Help needed standing up from a chair using your arms (e.g., wheelchair or bedside chair)?: A Little Help needed to walk in hospital room?: A Little Help needed climbing 3-5 steps with a railing? : A Little 6 Click Score: 20    End of Session Equipment Utilized During Treatment: Gait belt Activity Tolerance: Patient tolerated treatment well Patient left: in bed;with bed alarm set;with call bell/phone within reach Nurse Communication: Mobility status PT Visit Diagnosis: Difficulty in walking, not elsewhere classified (R26.2);Muscle weakness (generalized) (M62.81)     Time: 2229-7989 PT Time Calculation (min) (ACUTE ONLY): 20 min  Charges:  $Gait Training: 8-22 mins                     Tyreck Bell B. Migdalia Dk PT, DPT Acute Rehabilitation Services Pager [phone] Office [phone]    [place] 12/31/2020, 11:14 AM

## 2020-12-31 NOTE — Progress Notes (Signed)
Nutrition Brief Note  Admitting Dx: Transaminitis [R74.01] Acute renal failure (ARF) (HCC) [N17.9] Acute renal failure, unspecified acute renal failure type ([place]) [N17.9] AKI (acute kidney injury) ([place]) [N17.9] Acute kidney injury superimposed on chronic kidney disease ([place]) [N17.9, N18.9]  Past Medical History:  Diagnosis Date  . Atypical chest pain 07/16/2020  . Claudication in peripheral vascular disease ([place]) 06/04/2020  . COPD (chronic obstructive pulmonary disease) ([place])   . Coronary artery disease   . Coronary artery disease involving native coronary artery of native heart without angina pectoris 03/22/2015   Formatting of this note might be different from the original. Non-drug-eluting stent to completely occluded right coronary artery in July 2013, non-drug-eluting stent to proximal LAD in July 2013  . Essential hypertension 03/22/2015  . H/O degenerative disc disease   . History of anemia due to chronic kidney disease   . Hyperlipidemia   . Hypertension   . Macular degeneration   . Myocardial infarct ([place]) 2013  . Old MI (myocardial infarction) 2013  . Peripheral edema   . Pure hypercholesterolemia 03/22/2015  . Renal insufficiency   . Seasonal allergic rhinitis   . Smoking 03/22/2015  . Tobacco use 07/16/2017    RD consulted to provided diet education with the following written for the consult: "patient with CKD and with poor understanding of disease and importance of proper nutrition"  RD attempted to provided education in person, but pt was unavailable x3 attempts. Discussed pt with RN who notes pt now with discharge orders. Education on nutrition for CKD stage V for people not on dialysis provided in discharge summary.    Wt Readings from Last 15 Encounters:  12/27/20 88.3 kg  11/01/20 93 kg  07/16/20 95.3 kg  06/04/20 93.4 kg  05/14/20 93.9 kg    Body mass index is 27.93 kg/m. Patient meets criteria for overweight based on current BMI.   Current diet order is  heart healthy/carb modified, patient is consuming approximately 50% of meals at this time. RN notes pt has not enjoyed hospital meal trays.   Labs and medications reviewed.   Given pt is discharging, no nutrition interventions warranted at this time. If nutrition issues arise or if pt does not discharge, please consult RD.   Larkin Ina, MS, RD, LDN RD pager number and weekend/on-call pager number located in [place].

## 2021-02-04 ENCOUNTER — Other Ambulatory Visit (HOSPITAL_COMMUNITY): Payer: Self-pay

## 2021-02-09 DIAGNOSIS — Z20822 Contact with and (suspected) exposure to covid-19: Secondary | ICD-10-CM | POA: Diagnosis not present

## 2021-03-05 ENCOUNTER — Other Ambulatory Visit: Payer: Self-pay | Admitting: Family Medicine

## 2021-03-11 ENCOUNTER — Other Ambulatory Visit: Payer: Self-pay | Admitting: Family Medicine

## 2021-03-12 ENCOUNTER — Other Ambulatory Visit: Payer: Self-pay | Admitting: Family Medicine

## 2021-05-22 ENCOUNTER — Encounter: Payer: Self-pay | Admitting: Cardiology

## 2021-05-22 ENCOUNTER — Other Ambulatory Visit: Payer: Self-pay

## 2021-05-22 ENCOUNTER — Ambulatory Visit (INDEPENDENT_AMBULATORY_CARE_PROVIDER_SITE_OTHER): Payer: Medicare Other | Admitting: Cardiology

## 2021-05-22 VITALS — BP 92/60 | HR 66 | Ht 70.0 in | Wt 207.0 lb

## 2021-05-22 DIAGNOSIS — I2510 Atherosclerotic heart disease of native coronary artery without angina pectoris: Secondary | ICD-10-CM

## 2021-05-22 DIAGNOSIS — I10 Essential (primary) hypertension: Secondary | ICD-10-CM

## 2021-05-22 DIAGNOSIS — Z720 Tobacco use: Secondary | ICD-10-CM | POA: Diagnosis not present

## 2021-05-22 NOTE — Progress Notes (Signed)
Cardiology Office Note:    Date:  05/22/2021   ID:  Todd Mckee, DOB 21-Jul-1956, MRN [id number]  PCP:  Helen Hashimoto., MD  Cardiologist:  Jenne Campus, MD    Referring MD: Helen Hashimoto., MD   Chief Complaint  Patient presents with   Follow-up  I need urine bladder stone to be removed  History of Present Illness:    Todd Mckee is a 65 y.o. male with past medical history significant for coronary artery disease in 2013 he did have PTCA and stenting of the RCA as well as LAD.  He is a chronic smoker and does have dyslipidemia as well as essential hypertension and COPD.  Recently he ended going to the hospital because of acute kidney failure.  It was felt to be related to urinary bladder obstruction.  Since that time he seems to be doing fine but surgery is contemplated.  What I understand he will have a surgery to remove his urinary bladder stones.  He denies have any chest pain tightness squeezing pressure burning chest a lot of medication has been changed he was hypotensive he actually was given midodrine for short period of time to improve his blood pressure today 92/60.  He spent majority of time sitting in the chair and smoking cigarettes he tells me he smoked 1 pack/day I suspect this much more than that.  His fingers are yellow from smoke.  Past Medical History:  Diagnosis Date   Atypical chest pain 07/16/2020   Claudication in peripheral vascular disease ([place]) 06/04/2020   COPD (chronic obstructive pulmonary disease) (HCC)    Coronary artery disease    Coronary artery disease involving native coronary artery of native heart without angina pectoris 03/22/2015   Formatting of this note might be different from the original. Non-drug-eluting stent to completely occluded right coronary artery in July 2013, non-drug-eluting stent to proximal LAD in July 2013   Essential hypertension 03/22/2015   H/O degenerative disc disease    History of anemia due to chronic kidney disease     Hyperlipidemia    Hypertension    Macular degeneration    Myocardial infarct ([place]) 2013   Old MI (myocardial infarction) 2013   Peripheral edema    Pure hypercholesterolemia 03/22/2015   Renal insufficiency    Seasonal allergic rhinitis    Smoking 03/22/2015   Tobacco use 07/16/2017    Past Surgical History:  Procedure Laterality Date   CORONARY ANGIOPLASTY WITH STENT PLACEMENT  2013    Current Medications: Current Meds  Medication Sig   albuterol (VENTOLIN HFA) 108 (90 Base) MCG/ACT inhaler Inhale 2 puffs into the lungs as needed for wheezing or shortness of breath.   aspirin EC 81 MG tablet Take 81 mg by mouth daily. Swallow whole.   levothyroxine (SYNTHROID) 50 MCG tablet Take 1 tablet (50 mcg total) by mouth daily at 6 (six) AM.   midodrine (PROAMATINE) 10 MG tablet Take 1 tablet (10 mg total) by mouth 2 (two) times daily with a meal.   rOPINIRole (REQUIP) 0.5 MG tablet Take 0.5 mg by mouth 2 (two) times daily.   rosuvastatin (CRESTOR) 20 MG tablet Take 1 tablet (20 mg total) by mouth daily.   tamsulosin (FLOMAX) 0.4 MG CAPS capsule Take 1 capsule (0.4 mg total) by mouth daily.     Allergies:   Patient has no known allergies.   Social History   Socioeconomic History   Marital status: Divorced    Spouse name: Not on file  Number of children: Not on file   Years of education: Not on file   Highest education level: Not on file  Occupational History   Not on file  Tobacco Use   Smoking status: Every Day   Smokeless tobacco: Never  Substance and Sexual Activity   Alcohol use: Never   Drug use: Never   Sexual activity: Not on file  Other Topics Concern   Not on file  Social History Narrative   Not on file   Social Determinants of Health   Financial Resource Strain: Not on file  Food Insecurity: Not on file  Transportation Needs: Not on file  Physical Activity: Not on file  Stress: Not on file  Social Connections: Not on file     Family History: The  patient's family history includes Arthritis in his mother; Asthma in his father; Heart disease in his father; Hypertension in his father; Thyroid disease in his brother, father, and mother. ROS:   Please see the history of present illness.    All 14 point review of systems negative except as described per history of present illness  EKGs/Labs/Other Studies Reviewed:      Recent Labs: 12/26/2020: Magnesium 2.5 12/28/2020: TSH 154.007 12/31/2020: ALT 74; BUN 58; Creatinine, Ser 4.87; Hemoglobin 9.0; Platelets 134; Potassium 3.3; Sodium 143  Recent Lipid Panel No results found for: CHOL, TRIG, HDL, CHOLHDL, VLDL, LDLCALC, LDLDIRECT  Physical Exam:    VS:  BP 92/60 (BP Location: Left Arm, Patient Position: Sitting, Cuff Size: Normal)   Pulse 66   Ht 5\' 10"  (1.778 m)   Wt 207 lb (93.9 kg)   SpO2 95%   BMI 29.70 kg/m     Wt Readings from Last 3 Encounters:  05/22/21 207 lb (93.9 kg)  12/27/20 194 lb 10.7 oz (88.3 kg)  11/01/20 205 lb (93 kg)     GEN:  Well nourished, well developed in no acute distress HEENT: Normal NECK: No JVD; No carotid bruits LYMPHATICS: No lymphadenopathy CARDIAC: RRR, no murmurs, no rubs, no gallops RESPIRATORY:  Clear to auscultation without rales, wheezing or rhonchi  ABDOMEN: Soft, non-tender, non-distended MUSCULOSKELETAL:  No edema; No deformity  SKIN: Warm and dry LOWER EXTREMITIES: no swelling NEUROLOGIC:  Alert and oriented x 3 PSYCHIATRIC:  Normal affect   ASSESSMENT:    1. Coronary artery disease involving native coronary artery of native heart without angina pectoris   2. Essential hypertension   3. Primary hypertension   4. Tobacco use    PLAN:    In order of problems listed above:  Coronary disease status post PTA and stenting in 2013.  He need to be evaluated before surgery his ability to exercise very limited in spite of fact he does not have any symptoms I think need to be evaluated for coronary artery disease.  I will schedule  him to have [place]. Essential hypertension actually blood pressure is on the lower side.  A lot of medication has been withdrawn which was appropriate to move I will continue present management. Smoking obviously huge problem spent at least 5 minutes talking about is a strongly recommended to quit. Dyslipidemia I did review K PN which show me his LDL of 44 and HDL of 27.  He is on Crestor 20 which I will continue. I did review extensive record from the hospital for this visit.   Medication Adjustments/Labs and Tests Ordered: Current medicines are reviewed at length with the patient today.  Concerns regarding medicines are outlined above.  No orders of the defined types were placed in this encounter.  Medication changes: No orders of the defined types were placed in this encounter.   Signed, Park Liter, MD, [facility] 05/22/2021 10:27 AM    [place]

## 2021-05-22 NOTE — Addendum Note (Signed)
Addended by: Truddie Hidden on: 05/22/2021 10:39 AM   Modules accepted: Orders

## 2021-05-22 NOTE — Patient Instructions (Signed)
Medication Instructions:  Your physician recommends that you continue on your current medications as directed. Please refer to the Current Medication list given to you today.   *If you need a refill on your cardiac medications before your next appointment, please call your pharmacy*   Lab Work: None ordered If you have labs (blood work) drawn today and your tests are completely normal, you will receive your results only by: [place] (if you have MyChart) OR A paper copy in the mail If you have any lab test that is abnormal or we need to change your treatment, we will call you to review the results.   Testing/Procedures: Your physician has requested that you have a lexiscan myoview. For further information please visit [email or web address]. Please follow instruction sheet, as given.  The test will take approximately 3 to 4 hours to complete; you may bring reading material.  If someone comes with you to your appointment, they will need to remain in the main lobby due to limited space in the testing area.   How to prepare for your Myocardial Perfusion Test: Do not eat or drink 3 hours prior to your test, except you may have water. Do not consume products containing caffeine (regular or decaffeinated) 12 hours prior to your test. (ex: coffee, chocolate, sodas, tea). Do bring a list of your current medications with you.  If not listed below, you may take your medications as normal. Do wear comfortable clothes (no dresses or overalls) and walking shoes, tennis shoes preferred (No heels or open toe shoes are allowed). Do NOT wear cologne, perfume, aftershave, or lotions (deodorant is allowed). If these instructions are not followed, your test will have to be rescheduled.    Follow-Up: At [facility], you and your health needs are our priority.  As part of our continuing mission to provide you with exceptional heart care, we have created designated Provider Care Teams.  These Care  Teams include your primary Cardiologist (physician) and Advanced Practice Providers (APPs -  Physician Assistants and Nurse Practitioners) who all work together to provide you with the care you need, when you need it.  We recommend signing up for the patient portal called "MyChart".  Sign up information is provided on this After Visit Summary.  MyChart is used to connect with patients for Virtual Visits (Telemedicine).  Patients are able to view lab/test results, encounter notes, upcoming appointments, etc.  Non-urgent messages can be sent to your provider as well.   To learn more about what you can do with MyChart, go to [email or web address].    Your next appointment:   5 month(s)  The format for your next appointment:   In Person  Provider:   Jenne Campus, MD   Other Instructions Cardiac Nuclear Scan A cardiac nuclear scan is a test that is done to check the flow of blood to your heart. It is done when you are resting and when you are exercising. The test looks for problems such as: Not enough blood reaching a portion of the heart. The heart muscle not working as it should. You may need this test if: You have heart disease. You have had lab results that are not normal. You have had heart surgery or a balloon procedure to open up blocked arteries (angioplasty). You have chest pain. You have shortness of breath. In this test, a special dye (tracer) is put into your bloodstream. The tracer will travel to your heart. A camera will then take pictures of your heart to  see how the tracer moves through your heart. This test is usually done at a hospital and takes 2-4 hours. Tell a doctor about: Any allergies you have. All medicines you are taking, including vitamins, herbs, eye drops, creams, and over-the-counter medicines. Any problems you or family members have had with anesthetic medicines. Any blood disorders you have. Any surgeries you have had. Any medical conditions you  have. Whether you are pregnant or may be pregnant. What are the risks? Generally, this is a safe test. However, problems may occur, such as: Serious chest pain and heart attack. This is only a risk if the stress portion of the test is done. Rapid heartbeat. A feeling of warmth in your chest. This feeling usually does not last long. Allergic reaction to the tracer. What happens before the test? Ask your doctor about changing or stopping your normal medicines. This is important. Follow instructions from your doctor about what you cannot eat or drink. Remove your jewelry on the day of the test. What happens during the test? An IV tube will be inserted into one of your veins. Your doctor will give you a small amount of tracer through the IV tube. You will wait for 20-40 minutes while the tracer moves through your bloodstream. Your heart will be monitored with an electrocardiogram (ECG). You will lie down on an exam table. Pictures of your heart will be taken for about 15-20 minutes. You may also have a stress test. For this test, one of these things may be done: You will be asked to exercise on a treadmill or a stationary bike. You will be given medicines that will make your heart work harder. This is done if you are unable to exercise. When blood flow to your heart has peaked, a tracer will again be given through the IV tube. After 20-40 minutes, you will get back on the exam table. More pictures will be taken of your heart. Depending on the tracer that is used, more pictures may need to be taken 3-4 hours later. Your IV tube will be removed when the test is over. The test may vary among doctors and hospitals. What happens after the test? Ask your doctor: Whether you can return to your normal schedule, including diet, activities, and medicines. Whether you should drink more fluids. This will help to remove the tracer from your body. Drink enough fluid to keep your pee (urine) pale  yellow. Ask your doctor, or the department that is doing the test: When will my results be ready? How will I get my results? Summary A cardiac nuclear scan is a test that is done to check the flow of blood to your heart. Tell your doctor whether you are pregnant or may be pregnant. Before the test, ask your doctor about changing or stopping your normal medicines. This is important. Ask your doctor whether you can return to your normal activities. You may be asked to drink more fluids. This information is not intended to replace advice given to you by your health care provider. Make sure you discuss any questions you have with your health care provider. Document Revised: 11/09/2018 Document Reviewed: 01/03/2018 Elsevier Patient Education  [place].

## 2021-05-27 NOTE — Addendum Note (Signed)
Addended by: Jenne Campus on: 05/27/2021 09:50 AM   Modules accepted: Orders

## 2021-06-03 ENCOUNTER — Telehealth (HOSPITAL_COMMUNITY): Payer: Self-pay | Admitting: *Deleted

## 2021-06-03 NOTE — Telephone Encounter (Signed)
Left message on voicemail per DPR in reference to upcoming appointment scheduled on 06/10/21 at 8:00 with detailed instructions given per Myocardial Perfusion Study Information Sheet for the test. LM to arrive 15 minutes early, and that it is imperative to arrive on time for appointment to keep from having the test rescheduled. If you need to cancel or reschedule your appointment, please call the office within 24 hours of your appointment. Failure to do so may result in a cancellation of your appointment, and a $50 no show fee. Phone number given for call back for any questions.

## 2021-06-05 ENCOUNTER — Telehealth: Payer: Self-pay | Admitting: Cardiology

## 2021-06-05 ENCOUNTER — Other Ambulatory Visit: Payer: Self-pay | Admitting: Urology

## 2021-06-05 NOTE — Telephone Encounter (Signed)
      [facility] Pre-operative Risk Assessment    Patient Name: Todd Mckee  DOB: 01-Sep-1955 MRN: [id number]  HEARTCARE STAFF:  - IMPORTANT!!!!!! Under Visit Info/Reason for Call, type in Other and utilize the format Clearance MM/DD/YY or Clearance TBD. Do not use dashes or single digits. - Please review there is not already an duplicate clearance open for this procedure. - If request is for dental extraction, please clarify the # of teeth to be extracted. - If the patient is currently at the dentist's office, call Pre-Op Callback Staff (MA/nurse) to input urgent request.  - If the patient is not currently in the dentist office, please route to the Pre-Op pool.  Request for surgical clearance:  What type of surgery is being performed? TURP and bladder stone removal  When is this surgery scheduled? 06/19/21  What type of clearance is required (medical clearance vs. Pharmacy clearance to hold med vs. Both)? Medical  Are there any medications that need to be held prior to surgery and how long?   Practice name and name of physician performing surgery? Dr. Louis Meckel - [facility]  What is the office phone number? [place]   7.   What is the office fax number? [phone]  8.   Anesthesia type (None, local, MAC, general) ? General   Angeline S Hammer 06/05/2021, 2:44 PM  _________________________________________________________________   (provider comments below)

## 2021-06-05 NOTE — Telephone Encounter (Signed)
Left message for patient to return call.

## 2021-06-05 NOTE — Telephone Encounter (Signed)
Patient's daughter called to say after his results from his procedure on the 11/8. She ask that the report be fax over to [facility] fax number [phone]. Please advise

## 2021-06-06 NOTE — Telephone Encounter (Signed)
Left message for patient daughter to return call.  

## 2021-06-09 NOTE — Telephone Encounter (Signed)
Spoke to patient daughter. Informed her we would need signed release to send records. She informed me it was for a surgical clearance. Informed her we already have that once test is completed and it is determined if he is cleared or not preop poole will be in touch with office needing surgical clearance. Clearance form in chart.

## 2021-06-09 NOTE — Telephone Encounter (Signed)
Pending nuclear stress test tomorrow

## 2021-06-10 ENCOUNTER — Ambulatory Visit (INDEPENDENT_AMBULATORY_CARE_PROVIDER_SITE_OTHER): Payer: Medicare Other

## 2021-06-10 ENCOUNTER — Other Ambulatory Visit: Payer: Self-pay

## 2021-06-10 DIAGNOSIS — I2510 Atherosclerotic heart disease of native coronary artery without angina pectoris: Secondary | ICD-10-CM | POA: Diagnosis not present

## 2021-06-10 LAB — MYOCARDIAL PERFUSION IMAGING
LV dias vol: 107 mL (ref 62–150)
LV sys vol: 47 mL
Nuc Stress EF: 56 %
Peak HR: 86 {beats}/min
Rest HR: 65 {beats}/min
Rest Nuclear Isotope Dose: 8.5 mCi
SDS: 1
SRS: 3
SSS: 4
Stress Nuclear Isotope Dose: 25.2 mCi
TID: 0.98

## 2021-06-10 MED ORDER — TECHNETIUM TC 99M TETROFOSMIN IV KIT
25.2000 | PACK | Freq: Once | INTRAVENOUS | Status: AC | PRN
  Administered 2021-06-10: 25.2 via INTRAVENOUS

## 2021-06-10 MED ORDER — REGADENOSON 0.4 MG/5ML IV SOLN
0.4000 mg | Freq: Once | INTRAVENOUS | Status: AC
  Administered 2021-06-10: 0.4 mg via INTRAVENOUS

## 2021-06-10 MED ORDER — TECHNETIUM TC 99M TETROFOSMIN IV KIT
8.5000 | PACK | Freq: Once | INTRAVENOUS | Status: AC | PRN
  Administered 2021-06-10: 8.5 via INTRAVENOUS

## 2021-06-10 NOTE — Telephone Encounter (Signed)
    Patient Name: Todd Mckee  DOB: June 01, 1956 MRN: [id number]  Primary Cardiologist: Jenne Campus, MD  Chart reviewed as part of pre-operative protocol coverage. Given past medical history and time since last visit, based on ACC/AHA guidelines, [name] would be at acceptable risk for the planned procedure without further cardiovascular testing.   Nuclear stress test obtained on 06/10/2021 was low risk with normal EF of 56%.  The patient was advised that if he develops new symptoms prior to surgery to contact our office to arrange for a follow-up visit, and he verbalized understanding.  I will route this recommendation to the requesting party via Epic fax function and remove from pre-op pool.  Please call with questions.  [place], [place] 06/10/2021, 5:21 PM

## 2021-06-13 NOTE — Progress Notes (Addendum)
COVID swab appointment: 06/17/21  COVID Vaccine Completed:no Date COVID Vaccine completed: Has received booster: COVID vaccine manufacturer: [employer]   Date of COVID positive in last 90 days: no  PCP - Jenean Lindau, MD Cardiologist - Jenne Campus, MD  Cardiac clearance 06/05/21 by Jenne Campus in Epic  Chest x-ray - n/a EKG - 12/26/20 Epic Stress Test - 06/10/21 Epic ECHO - 06/26/20 Epic Cardiac Cath - stents Pacemaker/ICD device last checked:n/a Spinal Cord Stimulator:n/a  Sleep Study - n/a CPAP -   Fasting Blood Sugar - 80-160 Checks Blood Sugar __1_ times a day  Blood Thinner Instructions: Aspirin Instructions: ASA 81,  stop 5 days Last Dose:  Activity level: Can perform activities of daily living without stopping and without symptoms of chest pain or shortness of breath. Doesn't do stairs due to leg weakness and knees giving out.    Anesthesia review: HTN, CAD, MI, COPD, 1st degree AV block, CKD, hospitalized for acute renal failure 12/26/20, creatinine 1.94 results routed to Dr. Louis Meckel.  Patient denies shortness of breath, fever, cough and chest pain at PAT appointment   Patient verbalized understanding of instructions that were given to them at the PAT appointment. Patient was also instructed that they will need to review over the PAT instructions again at home before surgery.

## 2021-06-13 NOTE — Patient Instructions (Addendum)
DUE TO COVID-19 ONLY ONE VISITOR IS ALLOWED TO COME WITH YOU AND STAY IN THE WAITING ROOM ONLY DURING PRE OP AND PROCEDURE.   **NO VISITORS ARE ALLOWED IN THE SHORT STAY AREA OR RECOVERY ROOM!!**  IF YOU WILL BE ADMITTED INTO THE HOSPITAL YOU ARE ALLOWED ONLY TWO SUPPORT PEOPLE DURING VISITATION HOURS ONLY (10AM -8PM)   The support person(s) may change daily. The support person(s) must pass our screening, gel in and out, and wear a mask at all times, including in the patient's room. Patients must also wear a mask when staff or their support person are in the room.  No visitors under the age of 94. Any visitor under the age of 62 must be accompanied by an adult.    COVID SWAB TESTING MUST BE COMPLETED ON:  06/19/21       Your procedure is scheduled on: 06/19/21   Report to [facility] Main Entrance    Report to admitting at 6:00 AM   Call this number if you have problems the morning of surgery [phone]   Do not eat food :After Midnight.   May have liquids until 6:00 AM day of surgery  CLEAR LIQUID DIET  Foods Allowed                                                                     Foods Excluded  Water, Black Coffee and tea (no milk or creamer)           liquids that you cannot  Plain Jell-O in any flavor  (No red)                                    see through such as: Fruit ices (not with fruit pulp)                                            milk, soups, orange juice              Iced Popsicles (No red)                                               All solid food                                   Apple juices Sports drinks like Gatorade (No red) Lightly seasoned clear broth or consume(fat free) Sugar    Oral Hygiene is also important to reduce your risk of infection.                                    Remember - BRUSH YOUR TEETH THE MORNING OF SURGERY WITH YOUR REGULAR TOOTHPASTE   Do NOT smoke after Midnight   Take these medicines the morning of surgery  with A SIP OF WATER: Inhalers, Levothyroxine, Midodrine, Ropinirole, Rosuvastatin, Tamsulosin  DO NOT TAKE ANY ORAL DIABETIC MEDICATIONS DAY OF YOUR SURGERY  How to Manage Your Diabetes Before and After Surgery  Why is it important to control my blood sugar before and after surgery? Improving blood sugar levels before and after surgery helps healing and can limit problems. A way of improving blood sugar control is eating a healthy diet by:  Eating less sugar and carbohydrates  Increasing activity/exercise  Talking with your doctor about reaching your blood sugar goals High blood sugars (greater than 180 mg/dL) can raise your risk of infections and slow your recovery, so you will need to focus on controlling your diabetes during the weeks before surgery. Make sure that the doctor who takes care of your diabetes knows about your planned surgery including the date and location.  How do I manage my blood sugar before surgery? Check your blood sugar at least 4 times a day, starting 2 days before surgery, to make sure that the level is not too high or low. Check your blood sugar the morning of your surgery when you wake up and every 2 hours until you get to the Short Stay unit. If your blood sugar is less than 70 mg/dL, you will need to treat for low blood sugar: Do not take insulin. Treat a low blood sugar (less than 70 mg/dL) with  cup of clear juice (cranberry or apple), 4 glucose tablets, OR glucose gel. Recheck blood sugar in 15 minutes after treatment (to make sure it is greater than 70 mg/dL). If your blood sugar is not greater than 70 mg/dL on recheck, call [phone] for further instructions. Report your blood sugar to the short stay nurse when you get to Short Stay.  If you are admitted to the hospital after surgery: Your blood sugar will be checked by the staff and you will probably be given insulin after surgery (instead of oral diabetes medicines) to make sure you have good blood  sugar levels. The goal for blood sugar control after surgery is 80-180 mg/dL.   WHAT DO I DO ABOUT MY DIABETES MEDICATION?  Do not take oral diabetes medicines (pills) the morning of surgery.  THE DAY BEFORE SURGERY, do not take Jardiance       THE MORNING OF SURGERY, do not take Jardiance.  Reviewed and Endorsed by [facility] Patient Education Committee, August 2015                               You may not have any metal on your body including jewelry, and body piercing             Do not wear lotions, powders, cologne, or deodorant              Men may shave face and neck.   Do not bring valuables to the hospital. [place].   Contacts, dentures or bridgework may not be worn into surgery.   Bring small overnight bag day of surgery.   Special Instructions: Bring a copy of your healthcare power of attorney and living will documents         the day of surgery if you haven't scanned them before.   Please read over the following fact sheets you were given: IF YOU HAVE QUESTIONS  ABOUT YOUR PRE-OP INSTRUCTIONS PLEASE CALL [phone]- [facility] - Preparing for Surgery Before surgery, you can play an important role.  Because skin is not sterile, your skin needs to be as free of germs as possible.  You can reduce the number of germs on your skin by washing with CHG (chlorahexidine gluconate) soap before surgery.  CHG is an antiseptic cleaner which kills germs and bonds with the skin to continue killing germs even after washing. Please DO NOT use if you have an allergy to CHG or antibacterial soaps.  If your skin becomes reddened/irritated stop using the CHG and inform your nurse when you arrive at Short Stay. Do not shave (including legs and underarms) for at least 48 hours prior to the first CHG shower.  You may shave your face/neck.  Please follow these instructions carefully:  1.  Shower with CHG Soap the night before  surgery and the  morning of surgery.  2.  If you choose to wash your hair, wash your hair first as usual with your normal  shampoo.  3.  After you shampoo, rinse your hair and body thoroughly to remove the shampoo.                             4.  Use CHG as you would any other liquid soap.  You can apply chg directly to the skin and wash.  Gently with a scrungie or clean washcloth.  5.  Apply the CHG Soap to your body ONLY FROM THE NECK DOWN.   Do   not use on face/ open                           Wound or open sores. Avoid contact with eyes, ears mouth and   genitals (private parts).                       Wash face,  Genitals (private parts) with your normal soap.             6.  Wash thoroughly, paying special attention to the area where your    surgery  will be performed.  7.  Thoroughly rinse your body with warm water from the neck down.  8.  DO NOT shower/wash with your normal soap after using and rinsing off the CHG Soap.                9.  Pat yourself dry with a clean towel.            10.  Wear clean pajamas.            11.  Place clean sheets on your bed the night of your first shower and do not  sleep with pets. Day of Surgery : Do not apply any lotions/deodorants the morning of surgery.  Please wear clean clothes to the hospital/surgery center.  FAILURE TO FOLLOW THESE INSTRUCTIONS MAY RESULT IN THE CANCELLATION OF YOUR SURGERY  PATIENT SIGNATURE_________________________________  NURSE SIGNATURE__________________________________  ________________________________________________________________________

## 2021-06-16 ENCOUNTER — Encounter (HOSPITAL_COMMUNITY)
Admission: RE | Admit: 2021-06-16 | Discharge: 2021-06-16 | Disposition: A | Discharge status: Home / Self Care | Payer: Medicare Other | Source: Ambulatory Visit | Attending: Urology | Admitting: Urology

## 2021-06-16 ENCOUNTER — Other Ambulatory Visit: Payer: Self-pay

## 2021-06-16 ENCOUNTER — Telehealth: Payer: Self-pay | Admitting: Cardiology

## 2021-06-16 ENCOUNTER — Encounter (HOSPITAL_COMMUNITY): Payer: Self-pay

## 2021-06-16 VITALS — BP 110/71 | HR 64 | Temp 97.9°F | Resp 16 | Ht 70.0 in | Wt 205.4 lb

## 2021-06-16 DIAGNOSIS — E1122 Type 2 diabetes mellitus with diabetic chronic kidney disease: Secondary | ICD-10-CM | POA: Diagnosis not present

## 2021-06-16 DIAGNOSIS — C61 Malignant neoplasm of prostate: Secondary | ICD-10-CM | POA: Diagnosis not present

## 2021-06-16 DIAGNOSIS — I129 Hypertensive chronic kidney disease with stage 1 through stage 4 chronic kidney disease, or unspecified chronic kidney disease: Secondary | ICD-10-CM | POA: Diagnosis not present

## 2021-06-16 DIAGNOSIS — Z20822 Contact with and (suspected) exposure to covid-19: Secondary | ICD-10-CM | POA: Diagnosis not present

## 2021-06-16 DIAGNOSIS — E039 Hypothyroidism, unspecified: Secondary | ICD-10-CM | POA: Diagnosis not present

## 2021-06-16 DIAGNOSIS — E139 Other specified diabetes mellitus without complications: Secondary | ICD-10-CM

## 2021-06-16 DIAGNOSIS — E119 Type 2 diabetes mellitus without complications: Secondary | ICD-10-CM

## 2021-06-16 DIAGNOSIS — J449 Chronic obstructive pulmonary disease, unspecified: Secondary | ICD-10-CM | POA: Diagnosis not present

## 2021-06-16 DIAGNOSIS — I2510 Atherosclerotic heart disease of native coronary artery without angina pectoris: Secondary | ICD-10-CM | POA: Diagnosis not present

## 2021-06-16 DIAGNOSIS — F17210 Nicotine dependence, cigarettes, uncomplicated: Secondary | ICD-10-CM | POA: Insufficient documentation

## 2021-06-16 DIAGNOSIS — N189 Chronic kidney disease, unspecified: Secondary | ICD-10-CM | POA: Diagnosis not present

## 2021-06-16 DIAGNOSIS — N210 Calculus in bladder: Secondary | ICD-10-CM | POA: Diagnosis not present

## 2021-06-16 DIAGNOSIS — Z01812 Encounter for preprocedural laboratory examination: Secondary | ICD-10-CM | POA: Diagnosis present

## 2021-06-16 DIAGNOSIS — N401 Enlarged prostate with lower urinary tract symptoms: Secondary | ICD-10-CM | POA: Diagnosis present

## 2021-06-16 DIAGNOSIS — D291 Benign neoplasm of prostate: Secondary | ICD-10-CM | POA: Diagnosis not present

## 2021-06-16 DIAGNOSIS — I10 Essential (primary) hypertension: Secondary | ICD-10-CM | POA: Diagnosis not present

## 2021-06-16 HISTORY — DX: Hypothyroidism, unspecified: E03.9

## 2021-06-16 HISTORY — DX: Gastro-esophageal reflux disease without esophagitis: K21.9

## 2021-06-16 HISTORY — DX: Type 2 diabetes mellitus without complications: E11.9

## 2021-06-16 HISTORY — DX: Chronic kidney disease, unspecified: N18.9

## 2021-06-16 LAB — CBC
HCT: 39.7 % (ref 39.0–52.0)
Hemoglobin: 12.7 g/dL — ABNORMAL LOW (ref 13.0–17.0)
MCH: 30.8 pg (ref 26.0–34.0)
MCHC: 32 g/dL (ref 30.0–36.0)
MCV: 96.4 fL (ref 80.0–100.0)
Platelets: 252 10*3/uL (ref 150–400)
RBC: 4.12 MIL/uL — ABNORMAL LOW (ref 4.22–5.81)
RDW: 15 % (ref 11.5–15.5)
WBC: 8.5 10*3/uL (ref 4.0–10.5)
nRBC: 0 % (ref 0.0–0.2)

## 2021-06-16 LAB — COMPREHENSIVE METABOLIC PANEL
ALT: 27 U/L (ref 0–44)
AST: 25 U/L (ref 15–41)
Albumin: 4.5 g/dL (ref 3.5–5.0)
Alkaline Phosphatase: 63 U/L (ref 38–126)
Anion gap: 4 — ABNORMAL LOW (ref 5–15)
BUN: 32 mg/dL — ABNORMAL HIGH (ref 8–23)
CO2: 26 mmol/L (ref 22–32)
Calcium: 9.9 mg/dL (ref 8.9–10.3)
Chloride: 108 mmol/L (ref 98–111)
Creatinine, Ser: 1.94 mg/dL — ABNORMAL HIGH (ref 0.61–1.24)
GFR, Estimated: 37 mL/min — ABNORMAL LOW (ref >60)
Glucose, Bld: 115 mg/dL — ABNORMAL HIGH (ref 70–99)
Potassium: 4.7 mmol/L (ref 3.5–5.1)
Sodium: 138 mmol/L (ref 135–145)
Total Bilirubin: 0.8 mg/dL (ref 0.3–1.2)
Total Protein: 7.6 g/dL (ref 6.5–8.1)

## 2021-06-16 LAB — GLUCOSE, CAPILLARY: Glucose-Capillary: 103 mg/dL — ABNORMAL HIGH (ref 70–99)

## 2021-06-16 NOTE — Telephone Encounter (Signed)
Patient's daughter is returning call to discuss stress test results.

## 2021-06-16 NOTE — Telephone Encounter (Signed)
Spoke to patient daughter per dpr informed her of results.

## 2021-06-16 NOTE — Progress Notes (Addendum)
Patient was not able to provide a urine sample today. [facility] and relayed information to [facility].

## 2021-06-17 LAB — HEMOGLOBIN A1C
Hgb A1c MFr Bld: 6.8 % — ABNORMAL HIGH (ref 4.8–5.6)
Mean Plasma Glucose: 148.46 mg/dL

## 2021-06-17 NOTE — Progress Notes (Signed)
Anesthesia Chart Review   Case: [id number] Date/Time: 06/19/21 0845   Procedures:      TRANSURETHRAL RESECTION OF THE PROSTATE (TURP)     CYSTOSCOPY WITH LITHOLAPAXY   Anesthesia type: General   Pre-op diagnosis: BLADDER STONE, BENIGN PROSTATE HYPERPLASIA   Location: [facility]   Surgeons: [name], MD       DISCUSSION:65 y.o. every day smoker with h/o HTN, COPD, CAD (stent 2013), DM II (A1C 6.8), CKD (baseline creatinine1.5-2), hypothyroidism, bladder stone, BPH scheduled for above procedure 06/19/2021 with Dr. Louis Meckel.   Per cardiology preoperative evaluation 06/10/2021, "Chart reviewed as part of pre-operative protocol coverage. Given past medical history and time since last visit, based on ACC/AHA guidelines, Todd Mckee would be at acceptable risk for the planned procedure without further cardiovascular testing.    Nuclear stress test obtained on 06/10/2021 was low risk with normal EF of 56%."  Anticipate pt can proceed with planned procedure barring acute status change.   VS: BP 110/71   Pulse 64   Temp 36.6 C (Oral)   Resp 16   Ht 5\' 10"  (1.778 m)   Wt 93.2 kg   SpO2 100%   BMI 29.47 kg/m   PROVIDERS: Helen Hashimoto., MD is PCP   Tristan Schroeder, MD is Cardiologist  LABS: Labs reviewed: Acceptable for surgery. (all labs ordered are listed, but only abnormal results are displayed)  Labs Reviewed  CBC - Abnormal; Notable for the following components:      Result Value   RBC 4.12 (*)    Hemoglobin 12.7 (*)    All other components within normal limits  COMPREHENSIVE METABOLIC PANEL - Abnormal; Notable for the following components:   Glucose, Bld 115 (*)    BUN 32 (*)    Creatinine, Ser 1.94 (*)    GFR, Estimated 37 (*)    Anion gap 4 (*)    All other components within normal limits  GLUCOSE, CAPILLARY - Abnormal; Notable for the following components:   Glucose-Capillary 103 (*)    All other components within normal limits   URINE CULTURE     IMAGES:   EKG: 12/26/2020 Rate 46 bpm  Sinus bradycardia with 1st degree A-V block Low voltage QRS Incomplete right bundle branch block Septal infarct , age undetermined  CV: Stress Test 06/10/2021   The study is normal. The study is low risk.   Left ventricular function is normal. Nuclear stress EF: 56 %. The left ventricular ejection fraction is normal (55-65%). End diastolic cavity size is normal.   Prior study not available for comparison  Echo 06/26/2020  1. Left ventricular ejection fraction, by estimation, is 60 to 65%. The  left ventricle has normal function. The left ventricle has no regional  wall motion abnormalities.  Past Medical History:  Diagnosis Date   Atypical chest pain 07/16/2020   Chronic kidney disease    Claudication in peripheral vascular disease ([place]) 06/04/2020   COPD (chronic obstructive pulmonary disease) (HCC)    Coronary artery disease    Coronary artery disease involving native coronary artery of native heart without angina pectoris 03/22/2015   Formatting of this note might be different from the original. Non-drug-eluting stent to completely occluded right coronary artery in July 2013, non-drug-eluting stent to proximal LAD in July 2013   Diabetes mellitus without complication ([place])    Essential hypertension 03/22/2015   GERD (gastroesophageal reflux disease)    H/O degenerative disc disease    History  of anemia due to chronic kidney disease    Hyperlipidemia    Hypertension    Hypothyroidism    Macular degeneration    Myocardial infarct [facility]) 2013   Old MI (myocardial infarction) 2013   Peripheral edema    Pure hypercholesterolemia 03/22/2015   Renal insufficiency    Seasonal allergic rhinitis    Smoking 03/22/2015   Tobacco use 07/16/2017    Past Surgical History:  Procedure Laterality Date   CORONARY ANGIOPLASTY WITH STENT PLACEMENT  2013    MEDICATIONS:  albuterol (VENTOLIN HFA) 108 (90 Base) MCG/ACT  inhaler   aspirin EC 81 MG tablet   JARDIANCE 10 MG TABS tablet   levothyroxine (SYNTHROID) 50 MCG tablet   midodrine (PROAMATINE) 10 MG tablet   rOPINIRole (REQUIP) 0.5 MG tablet   rosuvastatin (CRESTOR) 20 MG tablet   tamsulosin (FLOMAX) 0.4 MG CAPS capsule   No current facility-administered medications for this encounter.     Todd Felix Ward, PA-C [facility] Pre-Surgical Testing [phone]

## 2021-06-17 NOTE — Progress Notes (Signed)
Hgba1c- 6.8 on 06/17/2021.

## 2021-06-18 NOTE — Anesthesia Preprocedure Evaluation (Addendum)
Anesthesia Evaluation  Patient identified by MRN, date of birth, ID band Patient awake    Reviewed: Allergy & Precautions, NPO status , Patient's Chart, lab work & pertinent test results  History of Anesthesia Complications Negative for: history of anesthetic complications  Airway Mallampati: I  TM Distance: >3 FB Neck ROM: Full    Dental  (+) Edentulous Upper, Edentulous Lower   Pulmonary COPD,  COPD inhaler, Current Smoker and Patient abstained from smoking.,    breath sounds clear to auscultation       Cardiovascular hypertension, (-) angina+ CAD, + Past MI, + Cardiac Stents (LAD, RCA) and + Peripheral Vascular Disease   Rhythm:Regular Rate:Normal  06/10/2021 stress: EF 56%, no ischemia '21 ECHO: EF 60-65%. The left ventricle has normal function, no regional wall motion abnormalities. Grade II DD, no significant valvular abnormalities   Neuro/Psych negative neurological ROS     GI/Hepatic Neg liver ROS, GERD  Controlled,  Endo/Other  diabetes (glu 116), Oral Hypoglycemic AgentsHypothyroidism   Renal/GU Renal InsufficiencyRenal disease     Musculoskeletal   Abdominal (+) + obese,   Peds  Hematology negative hematology ROS (+)   Anesthesia Other Findings   Reproductive/Obstetrics                           Anesthesia Physical Anesthesia Plan  ASA: 3  Anesthesia Plan: General   Post-op Pain Management:    Induction: Intravenous  PONV Risk Score and Plan: 1 and Ondansetron, Dexamethasone and Treatment may vary due to age or medical condition  Airway Management Planned: LMA  Additional Equipment: None  Intra-op Plan:   Post-operative Plan:   Informed Consent: I have reviewed the patients History and Physical, chart, labs and discussed the procedure including the risks, benefits and alternatives for the proposed anesthesia with the patient or authorized representative who has  indicated his/her understanding and acceptance.       Plan Discussed with: CRNA and Surgeon  Anesthesia Plan Comments: (See PAT note 06/16/2021, Konrad Felix Ward, PA-C)      Anesthesia Quick Evaluation

## 2021-06-19 ENCOUNTER — Encounter (HOSPITAL_COMMUNITY)
Admission: RE | Disposition: A | Discharge status: Home / Self Care | Payer: Self-pay | Source: Home / Self Care | Attending: Urology

## 2021-06-19 ENCOUNTER — Ambulatory Visit (HOSPITAL_COMMUNITY): Payer: Medicare Other | Admitting: Certified Registered Nurse Anesthetist

## 2021-06-19 ENCOUNTER — Encounter (HOSPITAL_COMMUNITY): Payer: Self-pay | Admitting: Urology

## 2021-06-19 ENCOUNTER — Other Ambulatory Visit: Payer: Self-pay

## 2021-06-19 ENCOUNTER — Observation Stay (HOSPITAL_COMMUNITY)
Admission: RE | Admit: 2021-06-19 | Discharge: 2021-06-20 | Disposition: A | Discharge status: Home / Self Care | Payer: Medicare Other | Attending: Urology | Admitting: Urology

## 2021-06-19 ENCOUNTER — Ambulatory Visit (HOSPITAL_COMMUNITY): Payer: Medicare Other | Admitting: Physician Assistant

## 2021-06-19 DIAGNOSIS — N210 Calculus in bladder: Secondary | ICD-10-CM | POA: Insufficient documentation

## 2021-06-19 DIAGNOSIS — Z01818 Encounter for other preprocedural examination: Secondary | ICD-10-CM

## 2021-06-19 DIAGNOSIS — I10 Essential (primary) hypertension: Secondary | ICD-10-CM | POA: Insufficient documentation

## 2021-06-19 DIAGNOSIS — E119 Type 2 diabetes mellitus without complications: Secondary | ICD-10-CM | POA: Insufficient documentation

## 2021-06-19 DIAGNOSIS — Z20822 Contact with and (suspected) exposure to covid-19: Secondary | ICD-10-CM | POA: Insufficient documentation

## 2021-06-19 DIAGNOSIS — N401 Enlarged prostate with lower urinary tract symptoms: Secondary | ICD-10-CM | POA: Diagnosis present

## 2021-06-19 DIAGNOSIS — N138 Other obstructive and reflux uropathy: Secondary | ICD-10-CM | POA: Diagnosis present

## 2021-06-19 DIAGNOSIS — E139 Other specified diabetes mellitus without complications: Secondary | ICD-10-CM

## 2021-06-19 DIAGNOSIS — F17210 Nicotine dependence, cigarettes, uncomplicated: Secondary | ICD-10-CM | POA: Insufficient documentation

## 2021-06-19 DIAGNOSIS — C61 Malignant neoplasm of prostate: Principal | ICD-10-CM | POA: Insufficient documentation

## 2021-06-19 HISTORY — PX: TRANSURETHRAL RESECTION OF PROSTATE: SHX73

## 2021-06-19 HISTORY — PX: CYSTOSCOPY WITH LITHOLAPAXY: SHX1425

## 2021-06-19 LAB — BASIC METABOLIC PANEL
Anion gap: 5 (ref 5–15)
BUN: 31 mg/dL — ABNORMAL HIGH (ref 8–23)
CO2: 24 mmol/L (ref 22–32)
Calcium: 8.4 mg/dL — ABNORMAL LOW (ref 8.9–10.3)
Chloride: 110 mmol/L (ref 98–111)
Creatinine, Ser: 1.98 mg/dL — ABNORMAL HIGH (ref 0.61–1.24)
GFR, Estimated: 37 mL/min — ABNORMAL LOW (ref >60)
Glucose, Bld: 267 mg/dL — ABNORMAL HIGH (ref 70–99)
Potassium: 4.7 mmol/L (ref 3.5–5.1)
Sodium: 139 mmol/L (ref 135–145)

## 2021-06-19 LAB — CBC
HCT: 35.7 % — ABNORMAL LOW (ref 39.0–52.0)
Hemoglobin: 11.5 g/dL — ABNORMAL LOW (ref 13.0–17.0)
MCH: 31.3 pg (ref 26.0–34.0)
MCHC: 32.2 g/dL (ref 30.0–36.0)
MCV: 97.3 fL (ref 80.0–100.0)
Platelets: 220 10*3/uL (ref 150–400)
RBC: 3.67 MIL/uL — ABNORMAL LOW (ref 4.22–5.81)
RDW: 14.9 % (ref 11.5–15.5)
WBC: 12.3 10*3/uL — ABNORMAL HIGH (ref 4.0–10.5)
nRBC: 0 % (ref 0.0–0.2)

## 2021-06-19 LAB — GLUCOSE, CAPILLARY
Glucose-Capillary: 116 mg/dL — ABNORMAL HIGH (ref 70–99)
Glucose-Capillary: 121 mg/dL — ABNORMAL HIGH (ref 70–99)

## 2021-06-19 LAB — SARS CORONAVIRUS 2 BY RT PCR (HOSPITAL ORDER, PERFORMED IN ~~LOC~~ HOSPITAL LAB): SARS Coronavirus 2: NEGATIVE

## 2021-06-19 SURGERY — TURP (TRANSURETHRAL RESECTION OF PROSTATE)
Anesthesia: General | Site: Prostate

## 2021-06-19 MED ORDER — LACTATED RINGERS IV SOLN: INTRAVENOUS | Status: DC

## 2021-06-19 MED ORDER — MEPERIDINE HCL 50 MG/ML IJ SOLN
6.2500 mg | INTRAMUSCULAR | Status: DC | PRN
Start: 2021-06-19 — End: 2021-06-19

## 2021-06-19 MED ORDER — SODIUM CHLORIDE 0.45 % IV SOLN: INTRAVENOUS | Status: DC

## 2021-06-19 MED ORDER — EPHEDRINE SULFATE-NACL 50-0.9 MG/10ML-% IV SOSY
PREFILLED_SYRINGE | INTRAVENOUS | Status: DC | PRN
  Administered 2021-06-19 (×3): 5 mg via INTRAVENOUS

## 2021-06-19 MED ORDER — PROPOFOL 10 MG/ML IV BOLUS
INTRAVENOUS | Status: AC
  Filled 2021-06-19: qty 20

## 2021-06-19 MED ORDER — ENSURE ENLIVE PO LIQD: 237.0000 mL | Freq: Two times a day (BID) | ORAL | Status: DC

## 2021-06-19 MED ORDER — DEXAMETHASONE SODIUM PHOSPHATE 10 MG/ML IJ SOLN
INTRAMUSCULAR | Status: AC
  Filled 2021-06-19: qty 1

## 2021-06-19 MED ORDER — ONDANSETRON HCL 4 MG/2ML IJ SOLN
INTRAMUSCULAR | Status: DC | PRN
  Administered 2021-06-19: 4 mg via INTRAVENOUS

## 2021-06-19 MED ORDER — SODIUM CHLORIDE 0.9 % IR SOLN
Status: DC | PRN
  Administered 2021-06-19: 3000 mL via INTRAVESICAL
  Administered 2021-06-19 (×2): 6000 mL via INTRAVESICAL
  Administered 2021-06-19 (×2): 3000 mL via INTRAVESICAL
  Administered 2021-06-19 (×8): 6000 mL via INTRAVESICAL

## 2021-06-19 MED ORDER — CHLORHEXIDINE GLUCONATE 0.12 % MT SOLN
15.0000 mL | Freq: Once | OROMUCOSAL | Status: AC
  Administered 2021-06-19: 07:00:00 15 mL via OROMUCOSAL

## 2021-06-19 MED ORDER — FENTANYL CITRATE (PF) 100 MCG/2ML IJ SOLN
INTRAMUSCULAR | Status: AC
  Filled 2021-06-19: qty 2

## 2021-06-19 MED ORDER — SODIUM CHLORIDE 0.9 % IR SOLN
3000.0000 mL | Status: DC
  Administered 2021-06-19 (×2): 3000 mL

## 2021-06-19 MED ORDER — CEFAZOLIN SODIUM-DEXTROSE 2-4 GM/100ML-% IV SOLN
2.0000 g | INTRAVENOUS | Status: AC
  Administered 2021-06-19: 09:00:00 2 g via INTRAVENOUS
  Filled 2021-06-19: qty 100

## 2021-06-19 MED ORDER — LIDOCAINE 2% (20 MG/ML) 5 ML SYRINGE
INTRAMUSCULAR | Status: DC | PRN
  Administered 2021-06-19: 20 mg via INTRAVENOUS

## 2021-06-19 MED ORDER — MIDAZOLAM HCL 2 MG/2ML IJ SOLN: 0.5000 mg | Freq: Once | INTRAMUSCULAR | Status: DC | PRN

## 2021-06-19 MED ORDER — FENTANYL CITRATE PF 50 MCG/ML IJ SOSY: 25.0000 ug | PREFILLED_SYRINGE | INTRAMUSCULAR | Status: DC | PRN

## 2021-06-19 MED ORDER — OXYCODONE HCL 5 MG PO TABS: 5.0000 mg | ORAL_TABLET | Freq: Once | ORAL | Status: DC | PRN

## 2021-06-19 MED ORDER — PROPOFOL 10 MG/ML IV BOLUS
INTRAVENOUS | Status: DC | PRN
  Administered 2021-06-19: 150 mg via INTRAVENOUS

## 2021-06-19 MED ORDER — PHENYLEPHRINE 40 MCG/ML (10ML) SYRINGE FOR IV PUSH (FOR BLOOD PRESSURE SUPPORT)
PREFILLED_SYRINGE | INTRAVENOUS | Status: DC | PRN
  Administered 2021-06-19 (×5): 80 ug via INTRAVENOUS

## 2021-06-19 MED ORDER — DEXAMETHASONE SODIUM PHOSPHATE 10 MG/ML IJ SOLN
INTRAMUSCULAR | Status: DC | PRN
  Administered 2021-06-19: 5 mg via INTRAVENOUS

## 2021-06-19 MED ORDER — ALBUTEROL SULFATE (2.5 MG/3ML) 0.083% IN NEBU: 2.5000 mg | INHALATION_SOLUTION | RESPIRATORY_TRACT | Status: DC | PRN

## 2021-06-19 MED ORDER — OXYCODONE HCL 5 MG/5ML PO SOLN: 5.0000 mg | Freq: Once | ORAL | Status: DC | PRN

## 2021-06-19 MED ORDER — TRAMADOL HCL 50 MG PO TABS
50.0000 mg | ORAL_TABLET | Freq: Four times a day (QID) | ORAL | Status: DC | PRN
Start: 2021-06-19 — End: 2021-06-20

## 2021-06-19 MED ORDER — ORAL CARE MOUTH RINSE: 15.0000 mL | Freq: Once | OROMUCOSAL | Status: AC

## 2021-06-19 MED ORDER — MIDODRINE HCL 5 MG PO TABS
10.0000 mg | ORAL_TABLET | Freq: Two times a day (BID) | ORAL | Status: DC
  Administered 2021-06-19 – 2021-06-20 (×2): 10 mg via ORAL
  Filled 2021-06-19 (×2): qty 2

## 2021-06-19 MED ORDER — ONDANSETRON HCL 4 MG/2ML IJ SOLN
INTRAMUSCULAR | Status: AC
  Filled 2021-06-19: qty 2

## 2021-06-19 MED ORDER — TRAMADOL HCL 50 MG PO TABS: 50.0000 mg | ORAL_TABLET | Freq: Four times a day (QID) | ORAL | 0 refills | Status: AC | PRN

## 2021-06-19 MED ORDER — FENTANYL CITRATE (PF) 100 MCG/2ML IJ SOLN
INTRAMUSCULAR | Status: DC | PRN
  Administered 2021-06-19: 25 ug via INTRAVENOUS
  Administered 2021-06-19: 50 ug via INTRAVENOUS
  Administered 2021-06-19 (×3): 25 ug via INTRAVENOUS
  Administered 2021-06-19: 50 ug via INTRAVENOUS

## 2021-06-19 MED ORDER — ROPINIROLE HCL 1 MG PO TABS
0.5000 mg | ORAL_TABLET | Freq: Two times a day (BID) | ORAL | Status: DC
  Administered 2021-06-19 – 2021-06-20 (×2): 0.5 mg via ORAL
  Filled 2021-06-19 (×2): qty 1

## 2021-06-19 MED ORDER — PROMETHAZINE HCL 25 MG/ML IJ SOLN: 6.2500 mg | INTRAMUSCULAR | Status: DC | PRN

## 2021-06-19 MED ORDER — MIDAZOLAM HCL 2 MG/2ML IJ SOLN
INTRAMUSCULAR | Status: AC
  Filled 2021-06-19: qty 2

## 2021-06-19 MED ORDER — LIDOCAINE HCL (PF) 2 % IJ SOLN
INTRAMUSCULAR | Status: AC
  Filled 2021-06-19: qty 10

## 2021-06-19 SURGICAL SUPPLY — 23 items
BAG URINE DRAIN 2000ML AR STRL (UROLOGICAL SUPPLIES) ×3 IMPLANT
BAG URO CATCHER STRL LF (MISCELLANEOUS) ×3 IMPLANT
CATH FOLEY 3WAY 30CC 22FR (CATHETERS) IMPLANT
CATH FOLEY 3WAY 30CC 24FR (CATHETERS) ×1
CATH URTH STD 24FR FL 3W 2 (CATHETERS) ×2 IMPLANT
CLOTH BEACON ORANGE TIMEOUT ST (SAFETY) ×3 IMPLANT
DRAPE FOOT SWITCH (DRAPES) ×3 IMPLANT
ELECT REM PT RETURN 15FT ADLT (MISCELLANEOUS) ×3 IMPLANT
GLOVE SURG ENC MOIS LTX SZ7.5 (GLOVE) ×3 IMPLANT
GLOVE SURG ENC TEXT LTX SZ7.5 (GLOVE) ×3 IMPLANT
GOWN STRL REUS W/TWL XL LVL3 (GOWN DISPOSABLE) ×3 IMPLANT
HOLDER FOLEY CATH W/STRAP (MISCELLANEOUS) ×3 IMPLANT
KIT PROBE TRILOGY 3.9X350 (MISCELLANEOUS) ×3 IMPLANT
KIT TURNOVER KIT A (KITS) IMPLANT
LOOP CUT BIPOLAR 24F LRG (ELECTROSURGICAL) IMPLANT
MANIFOLD NEPTUNE II (INSTRUMENTS) ×3 IMPLANT
PACK CYSTO (CUSTOM PROCEDURE TRAY) ×3 IMPLANT
SYR 30ML LL (SYRINGE) ×3 IMPLANT
SYR TOOMEY IRRIG 70ML (MISCELLANEOUS) ×3
SYRINGE TOOMEY IRRIG 70ML (MISCELLANEOUS) ×2 IMPLANT
TUBING CONNECTING 10 (TUBING) ×3 IMPLANT
TUBING STONE CATCHER TRILOGY (MISCELLANEOUS) ×3 IMPLANT
TUBING UROLOGY SET (TUBING) ×3 IMPLANT

## 2021-06-19 NOTE — Anesthesia Postprocedure Evaluation (Signed)
Anesthesia Post Note  Patient: Todd Mckee  Procedure(s) Performed: TRANSURETHRAL RESECTION OF THE PROSTATE (TURP) (Prostate) CYSTOSCOPY WITH LITHOLAPAXY (Bladder)     Patient location during evaluation: PACU Anesthesia Type: General Level of consciousness: awake and alert, patient cooperative and oriented Pain management: pain level controlled Vital Signs Assessment: post-procedure vital signs reviewed and stable Respiratory status: spontaneous breathing, nonlabored ventilation and respiratory function stable Cardiovascular status: blood pressure returned to baseline and stable Postop Assessment: no apparent nausea or vomiting and adequate PO intake Anesthetic complications: no   No notable events documented.  Last Vitals:  Vitals:   06/19/21 1215 06/19/21 1315  BP: 132/80   Pulse: 71 77  Resp: 13 17  Temp: 36.5 C   SpO2: 94% 93%    Last Pain:  Vitals:   06/19/21 1315  PainSc: 0-No pain                 Pristine Gladhill,E. Jamesha Ellsworth

## 2021-06-19 NOTE — Plan of Care (Signed)
  Problem: Activity: Goal: Risk for activity intolerance will decrease Outcome: Progressing   Problem: Education: Goal: Knowledge of the prescribed therapeutic regimen will improve Outcome: Progressing   Problem: Education: Goal: Knowledge of General Education information will improve Description: Including pain rating scale, medication(s)/side effects and non-pharmacologic comfort measures Outcome: Completed/Met

## 2021-06-19 NOTE — H&P (Signed)
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 On CT imaging he was found to have a very large bladder with bladder stones, 2.8cm being the largest. He was also found to have nephririts. A foley catheter was placed and he was diuresed. initial creatinine was 7.7 with a GFR of 7. His discharge creatinine was 4.87 and his GFR was 12. He was placed on Terasozin 5 mg daily and advised to avoid all nephrotoxic agents. HE is currently on Milrodrine for hypotension. He is wheelchair dependent at this time. He has had a foley catheter for 2 weeks and presents today for a trial of void. HE reports that prior to catheter placement he urinated frequently, got up once per night and had no urinary leakage. He is a current everyday smoker. He denies gross hematuria. Foley catheter is draining sediment/cloudy urine. He is in good spirits and quite jovial today but extremely hard of hearing and does not always the questions appropriately, most likely to not hearing well.   7/22: The patient subsequently failed his voiding trial. An ultrasound was obtained demonstrating no hydronephrosis and a distended bladder. He was taught how to CIC and instructed to perform this 3 4 times daily. The patient states that he has not been for performing CIC conceived felt like he was voiding more than he normally had a prior learning and thought that he was voiding better.   Interval: The patient follows up today 4 months after his last office visit. He has been cathing intermittently. He has been doing reasonably well. He is here for follow-up and to discuss further management. He denies any ongoing hematuria. He denies any dysuria. He has been doing well with cathing. He feels like he is doing a better job emptying his bladder. He has been seen by cardiology, and is scheduled for stress test  next week.     ALLERGIES: No Allergies    MEDICATIONS: Finasteride 5 mg tablet 1 tablet PO Daily  Tamsulosin Hcl 0.4 mg capsule  Albuterol Sulfate  Artificial Tears  Furosemide 40 mg tablet  Levothyroxine 50 mcg capsule  Midodrine Hcl 10 mg tablet  Ropinirole Hcl 0.5 mg tablet  Rosuvastatin Calcium 40 mg tablet  [facility] Aspirin Ec 81 mg tablet, delayed release     GU PSH: No GU PSH    NON-GU PSH: Cardiac Stent Placement     GU PMH: Bladder Stone - 01/31/2021 Urinary Retention - 01/31/2021, - 01/22/2021, - 01/15/2021, - 01/10/2021    NON-GU PMH: Asthma Diabetes Type 2 Glaucoma Heartburn Hypercholesterolemia Hypertension Liver Disease    FAMILY HISTORY: 1 - Son 1 Daughter - Daughter Kidney Stones - Runs in Family Prostate Cancer - Brother   SOCIAL HISTORY: Marital Status: Divorced Preferred Language: English; Ethnicity: Not Hispanic Or Latino Current Smoking Status: Patient smokes occasionally.   Tobacco Use Assessment Completed: Used Tobacco in last 30 days? Has never drank.  Drinks 3 caffeinated drinks per day.    REVIEW OF SYSTEMS:    GU Review Male:   Patient denies frequent urination, hard to postpone urination, burning/ pain with urination, get up at night to urinate, leakage of urine, stream starts and stops, trouble starting your stream, have to strain to urinate , erection problems, and penile pain.  Gastrointestinal (Upper):   Patient denies nausea, vomiting, and indigestion/ heartburn.  Gastrointestinal (Lower):  Patient denies diarrhea and constipation.  Constitutional:   Patient denies fever, night sweats, weight loss, and fatigue.  Skin:   Patient denies skin rash/ lesion and itching.  Eyes:   Patient denies blurred vision and double vision.  Ears/ Nose/ Throat:   Patient denies sore throat and sinus problems.  Hematologic/Lymphatic:   Patient denies swollen glands and easy bruising.  Cardiovascular:   Patient denies leg swelling and chest pains.   Respiratory:   Patient denies cough and shortness of breath.  Endocrine:   Patient denies excessive thirst.  Musculoskeletal:   Patient denies back pain and joint pain.  Neurological:   Patient denies headaches and dizziness.  Psychologic:   Patient denies depression and anxiety.   Notes: Pt CIC Recurrent UTI's    VITAL SIGNS: None   MULTI-SYSTEM PHYSICAL EXAMINATION:    Constitutional: Well-nourished. No physical deformities. Normally developed. Good grooming.  Respiratory: Normal breath sounds. No labored breathing, no use of accessory muscles.   Cardiovascular: Regular rate and rhythm. No murmur, no gallop. Normal temperature, normal extremity pulses, no swelling, no varicosities.      Complexity of Data:  Source Of History:  Patient  Records Review:   Previous Doctor Records, Previous Hospital Records, Previous Patient Records, POC Tool  Urine Test Review:   Urinalysis  Urodynamics Review:   Review Bladder Scan  X-Ray Review: C.T. Abdomen/Pelvis: Reviewed Films. Discussed With Patient.     PROCEDURES: None   ASSESSMENT:      ICD-10 Details  2 GU:   Bladder Stone - N21.0   3   Urinary Retention - R33.8   1 NON-GU:   Tobacco abuse counseling - Z71.6    PLAN:            Medications New Meds: Nicotine Patch 21 mg/24 hour (28)-14 mg/24 hour (14)-7 mg/24 hour (14) patch, transdermal daily, sequential 1 patch Transdermal Daily   #1  0 Refill(s)            Document Letter(s):  Created for Patient: Clinical Summary         Notes:   The patient has 2 large bladder stones and BPH with a history of urinary retention and bladder outlet obstruction. I recommended that we proceed to the operating room for cystolitholapaxy, bladder stone removal. I also recommended that we perform a concomitant TURP. I went through the surgery with the patient in detail. He understands that this requires a 1 night hospitalization. We discussed the risk and the benefits of the operation.   The  patient will need clearance from his cardiologist. He is scheduled for a stress test next week, and we will await these results prior to booking his surgery.

## 2021-06-19 NOTE — Discharge Instructions (Signed)

## 2021-06-19 NOTE — Transfer of Care (Signed)
Immediate Anesthesia Transfer of Care Note  Patient: Todd Mckee  Procedure(s) Performed: TRANSURETHRAL RESECTION OF THE PROSTATE (TURP) (Prostate) CYSTOSCOPY WITH LITHOLAPAXY (Bladder)  Patient Location: PACU  Anesthesia Type:General  Level of Consciousness: drowsy and patient cooperative  Airway & Oxygen Therapy: Patient Spontanous Breathing and Patient connected to face mask oxygen  Post-op Assessment: Report given to RN and Post -op Vital signs reviewed and stable  Post vital signs: Reviewed and stable  Last Vitals:  Vitals Value Taken Time  BP 143/83 06/19/21 1127  Temp    Pulse 88 06/19/21 1130  Resp 17 06/19/21 1130  SpO2 98 % 06/19/21 1130  Vitals shown include unvalidated device data.  Last Pain:  Vitals:   06/19/21 0647  PainSc: 0-No pain         Complications: No notable events documented.

## 2021-06-19 NOTE — Interval H&P Note (Signed)
History and Physical Interval Note:  06/19/2021 7:55 AM  Todd Mckee  has presented today for surgery, with the diagnosis of BLADDER STONE, BENIGN PROSTATE HYPERPLASIA.  The various methods of treatment have been discussed with the patient and family. After consideration of risks, benefits and other options for treatment, the patient has consented to  Procedure(s): TRANSURETHRAL RESECTION OF THE PROSTATE (TURP) (N/A) CYSTOSCOPY WITH LITHOLAPAXY (N/A) as a surgical intervention.  The patient's history has been reviewed, patient examined, no change in status, stable for surgery.  I have reviewed the patient's chart and labs.  Questions were answered to the patient's satisfaction.     [name]

## 2021-06-19 NOTE — Anesthesia Procedure Notes (Signed)
Procedure Name: LMA Insertion Date/Time: 06/19/2021 8:48 AM Performed by: West Pugh, CRNA Pre-anesthesia Checklist: Patient identified, Emergency Drugs available, Suction available, Patient being monitored and Timeout performed Patient Re-evaluated:Patient Re-evaluated prior to induction Oxygen Delivery Method: Circle system utilized Preoxygenation: Pre-oxygenation with 100% oxygen Induction Type: IV induction LMA: LMA with gastric port inserted Number of attempts: 1 Placement Confirmation: positive ETCO2 Tube secured with: Tape Dental Injury: Teeth and Oropharynx as per pre-operative assessment

## 2021-06-19 NOTE — Op Note (Signed)
Preoperative diagnosis:  Bladder stones, 4 cm in total Benign prostatic hypertrophy with obstruction  Postoperative diagnosis:  Same  Procedure: Cystolitholapaxy, 4 cm Transurethral resection of prostate  Surgeon: [name], MD  Anesthesia: General  Complications: None  Intraoperative findings:  #1: The patient had 2 stones, each approximately 2.5 - 3 cm #2: The patient's prostate was noted to have a large intravesical protrusion as well as high median bar.  EBL: Approximately 150 cc  Specimens: Bladder stones and prostate chips  Indication: Todd Mckee is a 65 y.o. patient with history of urinary retention and bladder stones.  After reviewing the management options for treatment, he elected to proceed with the above surgical procedure(s). We have discussed the potential benefits and risks of the procedure, side effects of the proposed treatment, the likelihood of the patient achieving the goals of the procedure, and any potential problems that might occur during the procedure or recuperation. Informed consent has been obtained.  Description of procedure:  The patient was taken to the operating room and general anesthesia was induced.  The patient was placed in the dorsal lithotomy position, prepped and draped in the usual sterile fashion, and preoperative antibiotics were administered. A preoperative time-out was performed.   21 French 30 degree cystoscope was gently passed through the patient's urethra and into the bladder under visual guidance.  Cystoscopy was performed demonstrating 2 very large bladder stones, but otherwise normal bladder mucosa.  The intravesical median lobe was protruding into the bladder and creating a high median bar.  The 21 French sheath was then exchanged for the nephroscope, rigid, which is gently passed and under visual guidance.  I then used the lithoclast with both ultrasound and the pneumatic hammer to treat the 2 large bladder stones.  Once  these were completely taken care of I remove the nephroscope and exchanged for the resectoscope sheath, 26 [place].  Then exchanged the visual obturator for the resection element and proceeded to perform a transurethral resection of the prostate in the routine fashion.  The patient did have a fair amount of anterior prostatic obstruction which I did remove.  Once the bladder neck was taken down and the lateral and anterior aspect of the prostate was removed I copiously fulgurated the area to achieve hemostasis and evacuated the prostate chips.  I placed a 24 French 30 cc three-way Foley catheter into the patient's bladder and hand irrigated gently to ensure there were no additional prostate chips, stones, or clots.  I then placed him on a slow continuous bladder irrigation.  He subsequently awoken, extubated, and returned to the PACU in excellent condition.  [name], M.D.

## 2021-06-20 ENCOUNTER — Encounter (HOSPITAL_COMMUNITY): Payer: Self-pay | Admitting: Urology

## 2021-06-20 DIAGNOSIS — C61 Malignant neoplasm of prostate: Secondary | ICD-10-CM | POA: Diagnosis not present

## 2021-06-20 MED ORDER — NICOTINE 21 MG/24HR TD PT24
21.0000 mg | MEDICATED_PATCH | Freq: Every day | TRANSDERMAL | Status: DC
Start: 2021-06-20 — End: 2021-06-20
  Administered 2021-06-20: 21 mg via TRANSDERMAL
  Filled 2021-06-20: qty 1

## 2021-06-20 MED ORDER — PHENAZOPYRIDINE HCL 200 MG PO TABS: 200.0000 mg | ORAL_TABLET | Freq: Three times a day (TID) | ORAL | 0 refills | Status: AC | PRN

## 2021-06-20 MED ORDER — CHLORHEXIDINE GLUCONATE CLOTH 2 % EX PADS: 6.0000 | MEDICATED_PAD | Freq: Every day | CUTANEOUS | Status: DC

## 2021-06-20 NOTE — Discharge Summary (Signed)
Date of admission: 06/19/2021  Date of discharge: 06/20/2021  Admission diagnosis: BPH w obstruction, bladder stones  Discharge diagnosis: same  Secondary diagnoses:  Patient Active Problem List   Diagnosis Date Noted   BPH with obstruction/lower urinary tract symptoms 06/19/2021   Bladder stones 12/28/2020   Hypothyroidism 12/28/2020   AKI (acute kidney injury) ([place]) 12/27/2020   Acute kidney injury superimposed on chronic kidney disease ([place]) 12/27/2020   Acute renal failure (ARF) ([place]) 12/26/2020   Hypertension    Coronary artery disease    Atypical chest pain 07/16/2020   Claudication in peripheral vascular disease ([place]) 06/04/2020   Seasonal allergic rhinitis    Renal insufficiency    Peripheral edema    Macular degeneration    History of anemia due to chronic kidney disease    H/O degenerative disc disease    COPD (chronic obstructive pulmonary disease) ([place])    Hyperlipidemia 07/16/2017   Tobacco use 07/16/2017   Essential hypertension 03/22/2015   Coronary artery disease involving native coronary artery of native heart without angina pectoris 03/22/2015   Smoking 03/22/2015   Pure hypercholesterolemia 03/22/2015   Old MI (myocardial infarction) 2013   Myocardial infarct [facility]) 2013    Procedures performed: Procedure(s): TRANSURETHRAL RESECTION OF THE PROSTATE (TURP) CYSTOSCOPY WITH LITHOLAPAXY  History and Physical: For full details, please see admission history and physical. Briefly, Todd Mckee is a 65 y.o. year old patient with enlarged prostate and large bladder stones with a history of urinary retention.   Hospital Course: Patient tolerated the procedure well.  He was then transferred to the floor after an uneventful PACU stay.  His hospital course was uncomplicated.  On POD#1 he had met discharge criteria: was eating a regular diet, was up and ambulating independently,  pain was well controlled, was voiding without a catheter, and was ready to for  discharge.  PE on day of discharge Vitals:   06/19/21 1639 06/19/21 1949 06/19/21 2327 06/20/21 0451  BP:  109/61 111/61 110/63  Pulse:  60 (!) 58 63  Resp:  '20 20 18  ' Temp:  98.5 F (36.9 C) 98.4 F (36.9 C) 98.7 F (37.1 C)  TempSrc:  Oral Oral Oral  SpO2:  96% 94% 97%  Weight: 93.2 kg     Height: '5\' 10"'  (1.778 m)      Intake/Output Summary (Last 24 hours) at 06/20/2021 0744 Last data filed at 06/20/2021 0658 Gross per 24 hour  Intake 11958.24 ml  Output 11850 ml  Net 108.24 ml    NAD Non-labored breathing Soft abdomen Foley removed - urine clear prior to removing the catheter. Symmetric lower extremities.  Laboratory values:  Recent Labs    06/19/21 1546  WBC 12.3*  HGB 11.5*  HCT 35.7*   Recent Labs    06/19/21 1546  NA 139  K 4.7  CL 110  CO2 24  GLUCOSE 267*  BUN 31*  CREATININE 1.98*  CALCIUM 8.4*   No results for input(s): LABPT, INR in the last 72 hours. No results for input(s): LABURIN in the last 72 hours. Results for orders placed or performed during the hospital encounter of 06/19/21  SARS Coronavirus 2 by RT PCR (hospital order, performed in [facility] hospital lab) Nasopharyngeal Nasopharyngeal Swab     Status: None   Collection Time: 06/19/21  6:22 AM   Specimen: Nasopharyngeal Swab  Result Value Ref Range Status   SARS Coronavirus 2 NEGATIVE NEGATIVE Final    Comment: (NOTE) SARS-CoV-2 target nucleic acids are  NOT DETECTED.  The SARS-CoV-2 RNA is generally detectable in upper and lower respiratory specimens during the acute phase of infection. The lowest concentration of SARS-CoV-2 viral copies this assay can detect is 250 copies / mL. A negative result does not preclude SARS-CoV-2 infection and should not be used as the sole basis for treatment or other patient management decisions.  A negative result may occur with improper specimen collection / handling, submission of specimen other than nasopharyngeal swab, presence of viral  mutation(s) within the areas targeted by this assay, and inadequate number of viral copies (<250 copies / mL). A negative result must be combined with clinical observations, patient history, and epidemiological information.  Fact Sheet for Patients:   StrictlyIdeas.no  Fact Sheet for Healthcare Providers: BankingDealers.co.za  This test is not yet approved or  cleared by the [place] FDA and has been authorized for detection and/or diagnosis of SARS-CoV-2 by FDA under an Emergency Use Authorization (EUA).  This EUA will remain in effect (meaning this test can be used) for the duration of the COVID-19 declaration under Section 564(b)(1) of the Act, 21 U.S.C. section 360bbb-3(b)(1), unless the authorization is terminated or revoked sooner.  Performed at [facility], [place] [address], [place], [place] [address]     Disposition: Home  Discharge instruction: The patient was instructed to be ambulatory but told to refrain from heavy lifting, strenuous activity, or driving.   Discharge medications:  Allergies as of 06/20/2021   No Known Allergies      Medication List     TAKE these medications    albuterol 108 (90 Base) MCG/ACT inhaler Commonly known as: VENTOLIN HFA Inhale 2 puffs into the lungs as needed for wheezing or shortness of breath.   aspirin EC 81 MG tablet Take 81 mg by mouth daily. Swallow whole.   Jardiance 10 MG Tabs tablet Generic drug: empagliflozin Take 10 mg by mouth daily.   levothyroxine 50 MCG tablet Commonly known as: SYNTHROID Take 1 tablet (50 mcg total) by mouth daily at 6 (six) AM.   midodrine 10 MG tablet Commonly known as: PROAMATINE Take 1 tablet (10 mg total) by mouth 2 (two) times daily with a meal.   phenazopyridine 200 MG tablet Commonly known as: Pyridium Take 1 tablet (200 mg total) by mouth 3 (three) times daily as needed for pain.   rOPINIRole 0.5 MG  tablet Commonly known as: REQUIP Take 0.5 mg by mouth 2 (two) times daily.   rosuvastatin 20 MG tablet Commonly known as: Crestor Take 1 tablet (20 mg total) by mouth daily.   traMADol 50 MG tablet Commonly known as: Ultram Take 1-2 tablets (50-100 mg total) by mouth every 6 (six) hours as needed for moderate pain.        Followup:   Follow-up Information     Karen Kays, NP Follow up on 07/07/2021.   Specialty: Nurse Practitioner Why: 9:45 Contact information: [address] [place] [address] [phone]

## 2021-06-24 LAB — SURGICAL PATHOLOGY

## 2021-08-10 DIAGNOSIS — Z20822 Contact with and (suspected) exposure to covid-19: Secondary | ICD-10-CM | POA: Diagnosis not present

## 2021-09-22 DIAGNOSIS — N184 Chronic kidney disease, stage 4 (severe): Secondary | ICD-10-CM | POA: Diagnosis not present

## 2021-09-22 DIAGNOSIS — E1122 Type 2 diabetes mellitus with diabetic chronic kidney disease: Secondary | ICD-10-CM | POA: Diagnosis not present

## 2021-09-22 DIAGNOSIS — Z720 Tobacco use: Secondary | ICD-10-CM | POA: Diagnosis not present

## 2021-09-22 DIAGNOSIS — I129 Hypertensive chronic kidney disease with stage 1 through stage 4 chronic kidney disease, or unspecified chronic kidney disease: Secondary | ICD-10-CM | POA: Diagnosis not present

## 2021-09-22 DIAGNOSIS — R972 Elevated prostate specific antigen [PSA]: Secondary | ICD-10-CM | POA: Diagnosis not present

## 2021-09-22 DIAGNOSIS — I2510 Atherosclerotic heart disease of native coronary artery without angina pectoris: Secondary | ICD-10-CM | POA: Diagnosis not present

## 2021-09-22 DIAGNOSIS — E785 Hyperlipidemia, unspecified: Secondary | ICD-10-CM | POA: Diagnosis not present

## 2021-09-25 DIAGNOSIS — Z7409 Other reduced mobility: Secondary | ICD-10-CM | POA: Diagnosis not present

## 2021-09-25 DIAGNOSIS — E785 Hyperlipidemia, unspecified: Secondary | ICD-10-CM | POA: Diagnosis not present

## 2021-09-25 DIAGNOSIS — N183 Chronic kidney disease, stage 3 unspecified: Secondary | ICD-10-CM | POA: Diagnosis not present

## 2021-09-25 DIAGNOSIS — Z79899 Other long term (current) drug therapy: Secondary | ICD-10-CM | POA: Diagnosis not present

## 2021-09-25 DIAGNOSIS — E039 Hypothyroidism, unspecified: Secondary | ICD-10-CM | POA: Diagnosis not present

## 2021-09-25 DIAGNOSIS — Z9181 History of falling: Secondary | ICD-10-CM | POA: Diagnosis not present

## 2021-09-25 DIAGNOSIS — I2510 Atherosclerotic heart disease of native coronary artery without angina pectoris: Secondary | ICD-10-CM | POA: Diagnosis not present

## 2021-09-25 DIAGNOSIS — E1165 Type 2 diabetes mellitus with hyperglycemia: Secondary | ICD-10-CM | POA: Diagnosis not present

## 2021-09-25 DIAGNOSIS — C61 Malignant neoplasm of prostate: Secondary | ICD-10-CM | POA: Diagnosis not present

## 2021-09-25 DIAGNOSIS — Z789 Other specified health status: Secondary | ICD-10-CM | POA: Diagnosis not present

## 2021-09-25 DIAGNOSIS — M5136 Other intervertebral disc degeneration, lumbar region: Secondary | ICD-10-CM | POA: Diagnosis not present

## 2021-09-25 DIAGNOSIS — J449 Chronic obstructive pulmonary disease, unspecified: Secondary | ICD-10-CM | POA: Diagnosis not present

## 2021-09-29 DIAGNOSIS — E1165 Type 2 diabetes mellitus with hyperglycemia: Secondary | ICD-10-CM | POA: Diagnosis not present

## 2021-09-29 DIAGNOSIS — N3943 Post-void dribbling: Secondary | ICD-10-CM | POA: Diagnosis not present

## 2021-09-29 DIAGNOSIS — C61 Malignant neoplasm of prostate: Secondary | ICD-10-CM | POA: Diagnosis not present

## 2021-10-01 ENCOUNTER — Other Ambulatory Visit: Payer: Self-pay | Admitting: Urology

## 2021-10-01 DIAGNOSIS — C61 Malignant neoplasm of prostate: Secondary | ICD-10-CM

## 2021-10-24 DIAGNOSIS — Z20822 Contact with and (suspected) exposure to covid-19: Secondary | ICD-10-CM | POA: Diagnosis not present

## 2021-10-27 DIAGNOSIS — E039 Hypothyroidism, unspecified: Secondary | ICD-10-CM | POA: Diagnosis not present

## 2021-11-03 ENCOUNTER — Ambulatory Visit
Admission: RE | Admit: 2021-11-03 | Discharge: 2021-11-03 | Disposition: A | Discharge status: Home / Self Care | Payer: Medicare Other | Source: Ambulatory Visit | Attending: Urology | Admitting: Urology

## 2021-11-03 DIAGNOSIS — C61 Malignant neoplasm of prostate: Secondary | ICD-10-CM

## 2021-11-03 DIAGNOSIS — K5730 Diverticulosis of large intestine without perforation or abscess without bleeding: Secondary | ICD-10-CM | POA: Diagnosis not present

## 2021-11-03 IMAGING — MR MR PROSTATE WO/W CM
12 series · 48 of 48 positions shown · IV contrast (multihance)
Comparison: None.

CLINICAL DATA: "Eval prostate cancer." Reviewing the patient's
medical records in [REDACTED], he had a TURP about 4 months ago
with Gleason 3+4=7 involvement of less than 5% of the submitted
tissue. PSA 2.98.

EXAM:
MR PROSTATE WITHOUT AND WITH CONTRAST
TECHNIQUE: Multiplanar multisequence MRI images were obtained of the pelvis
centered about the prostate. Pre and post contrast images were
obtained.
CONTRAST:  19mL MULTIHANCE GADOBENATE DIMEGLUMINE 529 MG/ML IV SOLN

[Series 3: T2 · coronal · 3.0mm · 0.56mm/px · 1 of 23 slices shown (1 of 3)]
[im 1/23]
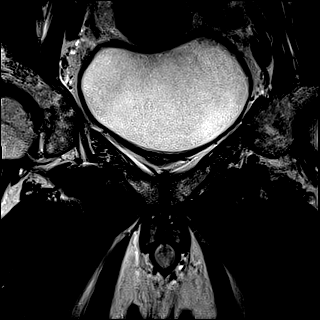

[Series 4: T1 · axial · 5.0mm · 1.25mm/px · 1 of 80 slices shown]
[im 1/80]
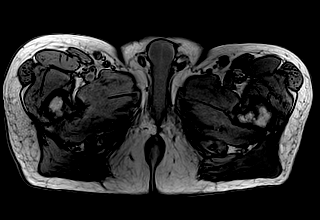

[Series 5: DWI · axial · 3.0mm · 1.75mm/px · 1 of 78 slices shown (1 of 3)]
[im 1/78]
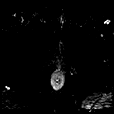

[Series 6: DWI · axial · 3.0mm · 1.75mm/px · 1 of 26 slices shown (2 of 3)]
[im 1/26]
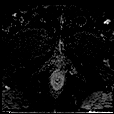

[Series 7: DWI · axial · 3.0mm · 1.75mm/px · 1 of 26 slices shown (3 of 3)]
[im 1/26]
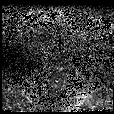

[Series 8: T2 · axial · 3.0mm · 0.56mm/px · 1 of 26 slices shown (2 of 3)]
[im 1/26]
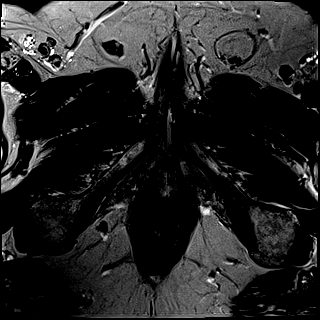

[Series 9: T2 · axial · 1.0mm · 1.04mm/px · 1 of 88 slices shown (3 of 3)]
[im 1/88]
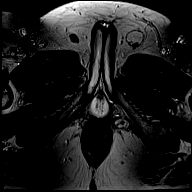

[Series 10: pre t1_twist_tra_dyn · axial · non-contrast · 3.5mm · 0.83mm/px · 1 of 26 slices shown]
[im 1/26]
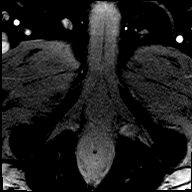

[Series 11: post t1_twist_tra_dyn-copy center · axial · non-contrast · 3.5mm · 0.83mm/px · z∈[+22,+109]mm · 18 of 780 slices shown]
[im 1/780]
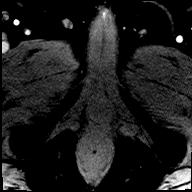
[im 46/780]
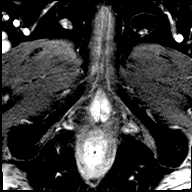
[im 92/780]
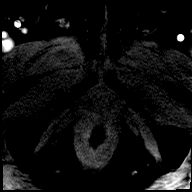
[im 138/780]
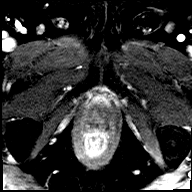
[im 184/780]
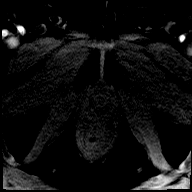
[im 230/780]
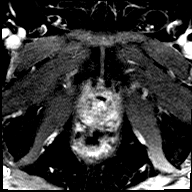
[im 275/780]
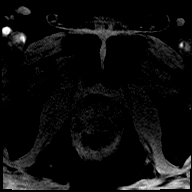
[im 321/780]
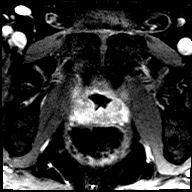
[im 367/780]
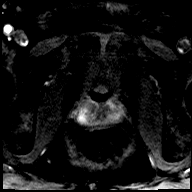
[im 413/780]
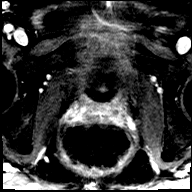
[im 459/780]
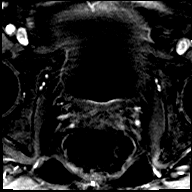
[im 505/780]
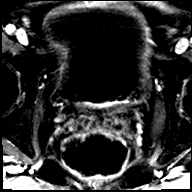
[im 550/780]
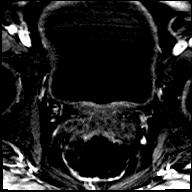
[im 596/780]
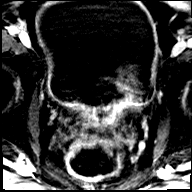
[im 642/780]
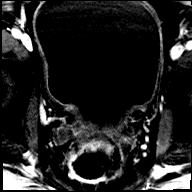
[im 688/780]
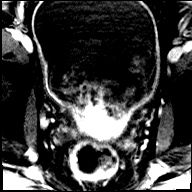
[im 734/780]
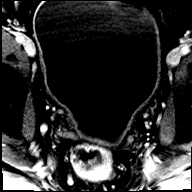
[im 780/780]
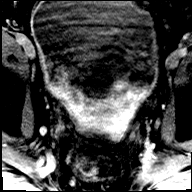

[Series 12: post t1_twist_tra_dyn-copy cent_sub · axial · 3.5mm · 0.83mm/px · z∈[+22,+109]mm · 18 of 754 slices shown]
[im 1/754]
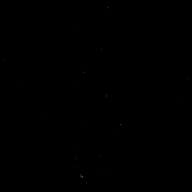
[im 45/754]
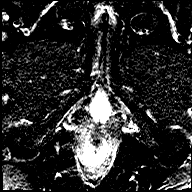
[im 89/754]
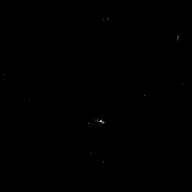
[im 133/754]
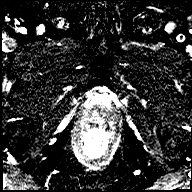
[im 178/754]
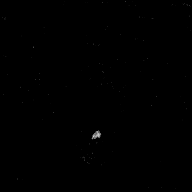
[im 222/754]
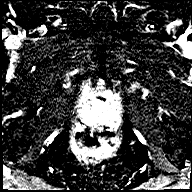
[im 266/754]
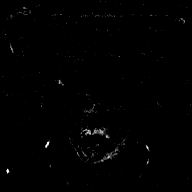
[im 311/754]
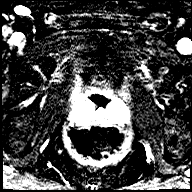
[im 355/754]
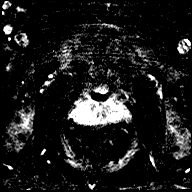
[im 399/754]
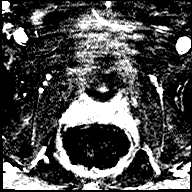
[im 443/754]
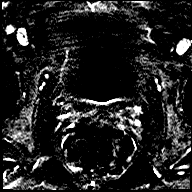
[im 488/754]
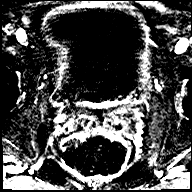
[im 532/754]
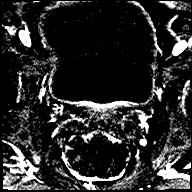
[im 576/754]
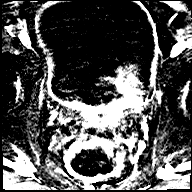
[im 621/754]
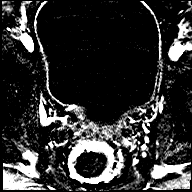
[im 665/754]
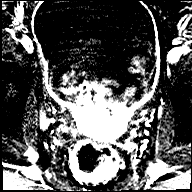
[im 709/754]
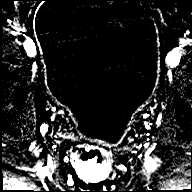
[im 754/754]
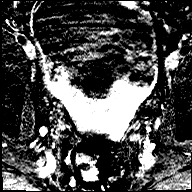

[Series 13: t1_vibe_dixon_tra_f · axial · 2.5mm · 0.91mm/px · z∈[+7,+204]mm · 2 of 80 slices shown]
[im 1/80]
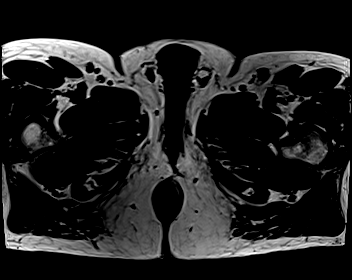
[im 80/80]
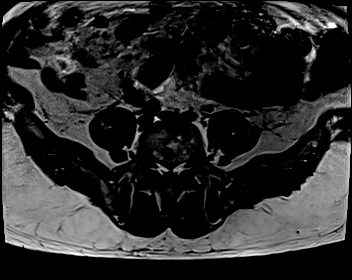

[Series 14: t1_vibe_dixon_tra_w · axial · 2.5mm · 0.91mm/px · z∈[+7,+204]mm · 2 of 80 slices shown]
[im 1/80]
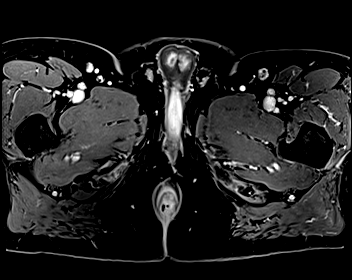
[im 80/80]
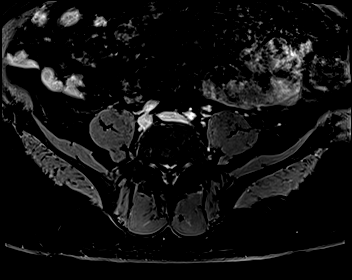

[48 of 48 positions shown; findings below may reference images not displayed]

FINDINGS: Prostate: Substantial tissue absence along the central superior
prostate gland with prior TURP. This encompasses much of the
transition zone.

Region of interest # 1: PI-RADS category 4 lesion the right
posterolateral peripheral zone in the mid gland and base, right
posteromedial peripheral zone in the mid gland, focally reduced ADC
map activity (image 16, series 6) and focal early enhancement (image
119 series 12). This measures 1.38 cc (2.4 by 1.1 by 1.3 cm).

Hazy low T2 signal in both peripheral zones, likely postinflammatory
and considered PI-RADS category 2.

Volume: 3D volumetric analysis: Prostate volume 19.25 cc (4.3 by
by 3.5 cm).

Transcapsular spread:  Absent

Seminal vesicle involvement: Absent

Neurovascular bundle involvement: Absent

Pelvic adenopathy: Absent

Bone metastasis: Absent

Other findings: Sigmoid colon diverticulosis.
IMPRESSION: 1. Sizable PI-RADS category 4 lesion of the right peripheral zone.
Targeting data sent to [facility].
2. Substantial TURP defect encompassing most of the transition zone.
3. Sigmoid colon diverticulosis.

## 2021-11-03 MED ORDER — GADOBENATE DIMEGLUMINE 529 MG/ML IV SOLN
19.0000 mL | Freq: Once | INTRAVENOUS | Status: AC | PRN
  Administered 2021-11-03: 19 mL via INTRAVENOUS

## 2021-11-07 ENCOUNTER — Other Ambulatory Visit: Payer: Self-pay

## 2021-11-10 ENCOUNTER — Ambulatory Visit: Payer: Medicare Other | Admitting: Cardiology

## 2021-11-17 DIAGNOSIS — C61 Malignant neoplasm of prostate: Secondary | ICD-10-CM | POA: Diagnosis not present

## 2021-11-17 DIAGNOSIS — N411 Chronic prostatitis: Secondary | ICD-10-CM | POA: Diagnosis not present

## 2021-11-17 DIAGNOSIS — N410 Acute prostatitis: Secondary | ICD-10-CM | POA: Diagnosis not present

## 2021-11-21 DIAGNOSIS — Z20822 Contact with and (suspected) exposure to covid-19: Secondary | ICD-10-CM | POA: Diagnosis not present

## 2021-11-28 DIAGNOSIS — Z20822 Contact with and (suspected) exposure to covid-19: Secondary | ICD-10-CM | POA: Diagnosis not present

## 2021-12-03 DIAGNOSIS — Z20822 Contact with and (suspected) exposure to covid-19: Secondary | ICD-10-CM | POA: Diagnosis not present

## 2021-12-04 DIAGNOSIS — Z20822 Contact with and (suspected) exposure to covid-19: Secondary | ICD-10-CM | POA: Diagnosis not present

## 2021-12-15 DIAGNOSIS — C61 Malignant neoplasm of prostate: Secondary | ICD-10-CM | POA: Diagnosis not present

## 2021-12-22 DIAGNOSIS — E1165 Type 2 diabetes mellitus with hyperglycemia: Secondary | ICD-10-CM | POA: Diagnosis not present

## 2021-12-22 DIAGNOSIS — E039 Hypothyroidism, unspecified: Secondary | ICD-10-CM | POA: Diagnosis not present

## 2021-12-22 DIAGNOSIS — I2510 Atherosclerotic heart disease of native coronary artery without angina pectoris: Secondary | ICD-10-CM | POA: Diagnosis not present

## 2021-12-22 DIAGNOSIS — N183 Chronic kidney disease, stage 3 unspecified: Secondary | ICD-10-CM | POA: Diagnosis not present

## 2021-12-22 DIAGNOSIS — J449 Chronic obstructive pulmonary disease, unspecified: Secondary | ICD-10-CM | POA: Diagnosis not present

## 2021-12-22 DIAGNOSIS — M5136 Other intervertebral disc degeneration, lumbar region: Secondary | ICD-10-CM | POA: Diagnosis not present

## 2021-12-22 DIAGNOSIS — Z789 Other specified health status: Secondary | ICD-10-CM | POA: Diagnosis not present

## 2021-12-22 DIAGNOSIS — I10 Essential (primary) hypertension: Secondary | ICD-10-CM | POA: Diagnosis not present

## 2021-12-22 DIAGNOSIS — Z7409 Other reduced mobility: Secondary | ICD-10-CM | POA: Diagnosis not present

## 2021-12-22 DIAGNOSIS — Z9181 History of falling: Secondary | ICD-10-CM | POA: Diagnosis not present

## 2021-12-22 DIAGNOSIS — E785 Hyperlipidemia, unspecified: Secondary | ICD-10-CM | POA: Diagnosis not present

## 2021-12-22 DIAGNOSIS — C61 Malignant neoplasm of prostate: Secondary | ICD-10-CM | POA: Diagnosis not present

## 2022-04-27 DIAGNOSIS — M5136 Other intervertebral disc degeneration, lumbar region: Secondary | ICD-10-CM | POA: Diagnosis not present

## 2022-04-27 DIAGNOSIS — E1165 Type 2 diabetes mellitus with hyperglycemia: Secondary | ICD-10-CM | POA: Diagnosis not present

## 2022-04-27 DIAGNOSIS — I10 Essential (primary) hypertension: Secondary | ICD-10-CM | POA: Diagnosis not present

## 2022-04-27 DIAGNOSIS — C61 Malignant neoplasm of prostate: Secondary | ICD-10-CM | POA: Diagnosis not present

## 2022-04-27 DIAGNOSIS — E785 Hyperlipidemia, unspecified: Secondary | ICD-10-CM | POA: Diagnosis not present

## 2022-04-27 DIAGNOSIS — J449 Chronic obstructive pulmonary disease, unspecified: Secondary | ICD-10-CM | POA: Diagnosis not present

## 2022-04-27 DIAGNOSIS — I2510 Atherosclerotic heart disease of native coronary artery without angina pectoris: Secondary | ICD-10-CM | POA: Diagnosis not present

## 2022-04-27 DIAGNOSIS — N183 Chronic kidney disease, stage 3 unspecified: Secondary | ICD-10-CM | POA: Diagnosis not present

## 2022-04-27 DIAGNOSIS — E039 Hypothyroidism, unspecified: Secondary | ICD-10-CM | POA: Diagnosis not present

## 2022-04-27 DIAGNOSIS — Z6830 Body mass index (BMI) 30.0-30.9, adult: Secondary | ICD-10-CM | POA: Diagnosis not present

## 2022-06-22 DIAGNOSIS — C61 Malignant neoplasm of prostate: Secondary | ICD-10-CM | POA: Diagnosis not present

## 2022-06-29 DIAGNOSIS — C61 Malignant neoplasm of prostate: Secondary | ICD-10-CM | POA: Diagnosis not present

## 2022-08-05 DIAGNOSIS — J209 Acute bronchitis, unspecified: Secondary | ICD-10-CM | POA: Diagnosis not present

## 2023-03-08 DIAGNOSIS — R338 Other retention of urine: Secondary | ICD-10-CM | POA: Diagnosis not present

## 2023-09-27 DIAGNOSIS — C61 Malignant neoplasm of prostate: Secondary | ICD-10-CM | POA: Diagnosis not present

## 2023-11-02 DIAGNOSIS — J441 Chronic obstructive pulmonary disease with (acute) exacerbation: Secondary | ICD-10-CM | POA: Diagnosis not present

## 2023-11-02 DIAGNOSIS — Z6831 Body mass index (BMI) 31.0-31.9, adult: Secondary | ICD-10-CM | POA: Diagnosis not present

## 2023-11-12 DIAGNOSIS — R319 Hematuria, unspecified: Secondary | ICD-10-CM | POA: Diagnosis not present

## 2023-11-12 DIAGNOSIS — R8290 Unspecified abnormal findings in urine: Secondary | ICD-10-CM | POA: Diagnosis not present

## 2023-11-12 DIAGNOSIS — N390 Urinary tract infection, site not specified: Secondary | ICD-10-CM | POA: Diagnosis not present

## 2023-11-12 DIAGNOSIS — C61 Malignant neoplasm of prostate: Secondary | ICD-10-CM | POA: Diagnosis not present

## 2023-11-12 DIAGNOSIS — N1832 Chronic kidney disease, stage 3b: Secondary | ICD-10-CM | POA: Diagnosis not present

## 2023-11-12 DIAGNOSIS — E1165 Type 2 diabetes mellitus with hyperglycemia: Secondary | ICD-10-CM | POA: Diagnosis not present

## 2023-11-12 DIAGNOSIS — Z6831 Body mass index (BMI) 31.0-31.9, adult: Secondary | ICD-10-CM | POA: Diagnosis not present

## 2023-11-17 DIAGNOSIS — R319 Hematuria, unspecified: Secondary | ICD-10-CM | POA: Diagnosis not present

## 2023-11-17 DIAGNOSIS — N1832 Chronic kidney disease, stage 3b: Secondary | ICD-10-CM | POA: Diagnosis not present

## 2023-11-17 DIAGNOSIS — C61 Malignant neoplasm of prostate: Secondary | ICD-10-CM | POA: Diagnosis not present

## 2023-11-17 DIAGNOSIS — E1165 Type 2 diabetes mellitus with hyperglycemia: Secondary | ICD-10-CM | POA: Diagnosis not present

## 2023-11-17 DIAGNOSIS — Z6831 Body mass index (BMI) 31.0-31.9, adult: Secondary | ICD-10-CM | POA: Diagnosis not present

## 2023-11-17 DIAGNOSIS — E871 Hypo-osmolality and hyponatremia: Secondary | ICD-10-CM | POA: Diagnosis not present

## 2023-11-17 DIAGNOSIS — E875 Hyperkalemia: Secondary | ICD-10-CM | POA: Diagnosis not present

## 2023-12-14 DIAGNOSIS — C61 Malignant neoplasm of prostate: Secondary | ICD-10-CM | POA: Diagnosis not present

## 2023-12-16 DIAGNOSIS — E785 Hyperlipidemia, unspecified: Secondary | ICD-10-CM | POA: Diagnosis not present

## 2023-12-16 DIAGNOSIS — N183 Chronic kidney disease, stage 3 unspecified: Secondary | ICD-10-CM | POA: Diagnosis not present

## 2023-12-16 DIAGNOSIS — J449 Chronic obstructive pulmonary disease, unspecified: Secondary | ICD-10-CM | POA: Diagnosis not present

## 2023-12-16 DIAGNOSIS — M51369 Other intervertebral disc degeneration, lumbar region without mention of lumbar back pain or lower extremity pain: Secondary | ICD-10-CM | POA: Diagnosis not present

## 2023-12-16 DIAGNOSIS — E1165 Type 2 diabetes mellitus with hyperglycemia: Secondary | ICD-10-CM | POA: Diagnosis not present

## 2023-12-16 DIAGNOSIS — C61 Malignant neoplasm of prostate: Secondary | ICD-10-CM | POA: Diagnosis not present

## 2023-12-16 DIAGNOSIS — I10 Essential (primary) hypertension: Secondary | ICD-10-CM | POA: Diagnosis not present

## 2023-12-16 DIAGNOSIS — E039 Hypothyroidism, unspecified: Secondary | ICD-10-CM | POA: Diagnosis not present

## 2023-12-16 DIAGNOSIS — I2510 Atherosclerotic heart disease of native coronary artery without angina pectoris: Secondary | ICD-10-CM | POA: Diagnosis not present

## 2023-12-20 DIAGNOSIS — C61 Malignant neoplasm of prostate: Secondary | ICD-10-CM | POA: Diagnosis not present

## 2024-04-13 DIAGNOSIS — Z720 Tobacco use: Secondary | ICD-10-CM | POA: Diagnosis not present

## 2024-04-13 DIAGNOSIS — I10 Essential (primary) hypertension: Secondary | ICD-10-CM | POA: Diagnosis not present

## 2024-04-13 DIAGNOSIS — N183 Chronic kidney disease, stage 3 unspecified: Secondary | ICD-10-CM | POA: Diagnosis not present

## 2024-04-13 DIAGNOSIS — E785 Hyperlipidemia, unspecified: Secondary | ICD-10-CM | POA: Diagnosis not present

## 2024-04-13 DIAGNOSIS — J441 Chronic obstructive pulmonary disease with (acute) exacerbation: Secondary | ICD-10-CM | POA: Diagnosis not present

## 2024-04-13 DIAGNOSIS — E039 Hypothyroidism, unspecified: Secondary | ICD-10-CM | POA: Diagnosis not present

## 2024-05-11 DIAGNOSIS — R7401 Elevation of levels of liver transaminase levels: Secondary | ICD-10-CM | POA: Diagnosis not present
